# Patient Record
Sex: Male | Born: 1985 | Race: White | Hispanic: No | Marital: Married | Smoking: Former smoker
Health system: Southern US, Community
[De-identification: ages and names within clinical notes are randomized; demographics above are authoritative.]

## PROBLEM LIST (undated history)

## (undated) DIAGNOSIS — T8859XA Other complications of anesthesia, initial encounter: Secondary | ICD-10-CM

## (undated) DIAGNOSIS — Z8489 Family history of other specified conditions: Secondary | ICD-10-CM

## (undated) DIAGNOSIS — K5730 Diverticulosis of large intestine without perforation or abscess without bleeding: Secondary | ICD-10-CM

## (undated) DIAGNOSIS — M1990 Unspecified osteoarthritis, unspecified site: Secondary | ICD-10-CM

## (undated) HISTORY — PX: COLONOSCOPY: SHX174

## (undated) HISTORY — PX: OTHER SURGICAL HISTORY: SHX169

---

## 2011-08-10 ENCOUNTER — Other Ambulatory Visit (HOSPITAL_COMMUNITY): Payer: Self-pay | Admitting: *Deleted

## 2011-08-10 ENCOUNTER — Ambulatory Visit (HOSPITAL_COMMUNITY)
Admission: RE | Admit: 2011-08-10 | Discharge: 2011-08-10 | Disposition: A | Discharge status: Home / Self Care | Payer: BC Managed Care – PPO | Source: Ambulatory Visit | Attending: *Deleted | Admitting: *Deleted

## 2011-08-10 DIAGNOSIS — D72829 Elevated white blood cell count, unspecified: Secondary | ICD-10-CM | POA: Insufficient documentation

## 2011-08-10 DIAGNOSIS — K5730 Diverticulosis of large intestine without perforation or abscess without bleeding: Secondary | ICD-10-CM | POA: Insufficient documentation

## 2011-08-10 DIAGNOSIS — R109 Unspecified abdominal pain: Secondary | ICD-10-CM | POA: Insufficient documentation

## 2011-08-10 MED ORDER — IOHEXOL 300 MG/ML  SOLN
100.0000 mL | Freq: Once | INTRAMUSCULAR | Status: AC | PRN
  Administered 2011-08-10: 100 mL via INTRAVENOUS

## 2012-08-18 ENCOUNTER — Ambulatory Visit: Payer: Self-pay | Admitting: Gastroenterology

## 2012-08-19 LAB — PATHOLOGY REPORT

## 2012-09-09 ENCOUNTER — Ambulatory Visit: Payer: BC Managed Care – PPO | Admitting: Family Medicine

## 2013-02-23 DIAGNOSIS — K5792 Diverticulitis of intestine, part unspecified, without perforation or abscess without bleeding: Secondary | ICD-10-CM | POA: Insufficient documentation

## 2013-05-21 DIAGNOSIS — E669 Obesity, unspecified: Secondary | ICD-10-CM | POA: Insufficient documentation

## 2013-05-21 DIAGNOSIS — L906 Striae atrophicae: Secondary | ICD-10-CM | POA: Insufficient documentation

## 2013-06-02 HISTORY — PX: COLON RESECTION SIGMOID: SHX6737

## 2015-02-14 DIAGNOSIS — Z8719 Personal history of other diseases of the digestive system: Secondary | ICD-10-CM | POA: Insufficient documentation

## 2015-02-14 DIAGNOSIS — M7541 Impingement syndrome of right shoulder: Secondary | ICD-10-CM | POA: Insufficient documentation

## 2015-02-14 DIAGNOSIS — J302 Other seasonal allergic rhinitis: Secondary | ICD-10-CM | POA: Insufficient documentation

## 2016-09-03 DIAGNOSIS — M7711 Lateral epicondylitis, right elbow: Secondary | ICD-10-CM | POA: Diagnosis not present

## 2017-03-10 DIAGNOSIS — Z0000 Encounter for general adult medical examination without abnormal findings: Secondary | ICD-10-CM | POA: Diagnosis not present

## 2017-08-04 ENCOUNTER — Other Ambulatory Visit: Payer: Self-pay | Admitting: Orthopedic Surgery

## 2017-08-04 DIAGNOSIS — M25562 Pain in left knee: Secondary | ICD-10-CM

## 2017-08-13 ENCOUNTER — Ambulatory Visit
Admission: RE | Admit: 2017-08-13 | Discharge: 2017-08-13 | Disposition: A | Discharge status: Home / Self Care | Payer: No Typology Code available for payment source | Source: Ambulatory Visit | Attending: Orthopedic Surgery | Admitting: Orthopedic Surgery

## 2017-08-13 DIAGNOSIS — R937 Abnormal findings on diagnostic imaging of other parts of musculoskeletal system: Secondary | ICD-10-CM | POA: Insufficient documentation

## 2017-08-13 DIAGNOSIS — M25562 Pain in left knee: Secondary | ICD-10-CM | POA: Diagnosis not present

## 2018-02-05 ENCOUNTER — Other Ambulatory Visit: Payer: Self-pay | Admitting: Orthopedic Surgery

## 2018-02-05 DIAGNOSIS — M7711 Lateral epicondylitis, right elbow: Secondary | ICD-10-CM

## 2018-02-19 ENCOUNTER — Ambulatory Visit
Admission: RE | Admit: 2018-02-19 | Discharge: 2018-02-19 | Disposition: A | Discharge status: Home / Self Care | Payer: No Typology Code available for payment source | Source: Ambulatory Visit | Attending: Orthopedic Surgery | Admitting: Orthopedic Surgery

## 2018-02-19 DIAGNOSIS — X58XXXA Exposure to other specified factors, initial encounter: Secondary | ICD-10-CM | POA: Insufficient documentation

## 2018-02-19 DIAGNOSIS — M7711 Lateral epicondylitis, right elbow: Secondary | ICD-10-CM | POA: Diagnosis not present

## 2018-02-19 DIAGNOSIS — S56511A Strain of other extensor muscle, fascia and tendon at forearm level, right arm, initial encounter: Secondary | ICD-10-CM | POA: Insufficient documentation

## 2018-02-26 ENCOUNTER — Ambulatory Visit: Payer: No Typology Code available for payment source

## 2018-03-02 DIAGNOSIS — M7712 Lateral epicondylitis, left elbow: Secondary | ICD-10-CM | POA: Insufficient documentation

## 2018-03-05 ENCOUNTER — Other Ambulatory Visit: Payer: Self-pay

## 2018-03-05 ENCOUNTER — Encounter: Payer: Self-pay | Admitting: *Deleted

## 2018-03-11 ENCOUNTER — Ambulatory Visit
Admission: RE | Admit: 2018-03-11 | Discharge: 2018-03-11 | Disposition: A | Discharge status: Home / Self Care | Payer: No Typology Code available for payment source | Source: Ambulatory Visit | Attending: Surgery | Admitting: Surgery

## 2018-03-11 ENCOUNTER — Encounter
Admission: RE | Disposition: A | Discharge status: Home / Self Care | Payer: Self-pay | Source: Ambulatory Visit | Attending: Surgery

## 2018-03-11 ENCOUNTER — Ambulatory Visit: Payer: No Typology Code available for payment source | Admitting: Anesthesiology

## 2018-03-11 DIAGNOSIS — M7711 Lateral epicondylitis, right elbow: Secondary | ICD-10-CM | POA: Insufficient documentation

## 2018-03-11 DIAGNOSIS — Z87891 Personal history of nicotine dependence: Secondary | ICD-10-CM | POA: Diagnosis not present

## 2018-03-11 HISTORY — PX: REPAIR EXTENSOR TENDON: SHX5382

## 2018-03-11 HISTORY — DX: Unspecified osteoarthritis, unspecified site: M19.90

## 2018-03-11 HISTORY — DX: Diverticulosis of large intestine without perforation or abscess without bleeding: K57.30

## 2018-03-11 SURGERY — REPAIR, TENDON, EXTENSOR
Anesthesia: General | Site: Elbow | Laterality: Right

## 2018-03-11 MED ORDER — PROPOFOL 10 MG/ML IV BOLUS
INTRAVENOUS | Status: DC | PRN
  Administered 2018-03-11: 50 mg via INTRAVENOUS
  Administered 2018-03-11: 150 mg via INTRAVENOUS

## 2018-03-11 MED ORDER — POTASSIUM CHLORIDE IN NACL 20-0.9 MEQ/L-% IV SOLN: INTRAVENOUS | Status: DC

## 2018-03-11 MED ORDER — DEXAMETHASONE SODIUM PHOSPHATE 4 MG/ML IJ SOLN
INTRAMUSCULAR | Status: DC | PRN
  Administered 2018-03-11: 4 mg via INTRAVENOUS

## 2018-03-11 MED ORDER — MIDAZOLAM HCL 5 MG/5ML IJ SOLN
INTRAMUSCULAR | Status: DC | PRN
  Administered 2018-03-11: 2 mg via INTRAVENOUS

## 2018-03-11 MED ORDER — CEFAZOLIN SODIUM-DEXTROSE 2-4 GM/100ML-% IV SOLN
2.0000 g | Freq: Once | INTRAVENOUS | Status: AC
  Administered 2018-03-11: 2 g via INTRAVENOUS

## 2018-03-11 MED ORDER — GLYCOPYRROLATE 0.2 MG/ML IJ SOLN
INTRAMUSCULAR | Status: DC | PRN
  Administered 2018-03-11: 0.1 mg via INTRAVENOUS

## 2018-03-11 MED ORDER — BUPIVACAINE HCL (PF) 0.5 % IJ SOLN
INTRAMUSCULAR | Status: DC | PRN
  Administered 2018-03-11: 10 mL

## 2018-03-11 MED ORDER — HYDROCODONE-ACETAMINOPHEN 5-325 MG PO TABS: 1.0000 | ORAL_TABLET | Freq: Four times a day (QID) | ORAL | 0 refills | Status: DC | PRN

## 2018-03-11 MED ORDER — ACETAMINOPHEN 325 MG PO TABS: 325.0000 mg | ORAL_TABLET | ORAL | Status: DC | PRN

## 2018-03-11 MED ORDER — PROMETHAZINE HCL 25 MG/ML IJ SOLN: 6.2500 mg | INTRAMUSCULAR | Status: DC | PRN

## 2018-03-11 MED ORDER — FENTANYL CITRATE (PF) 100 MCG/2ML IJ SOLN: 25.0000 ug | INTRAMUSCULAR | Status: DC | PRN

## 2018-03-11 MED ORDER — FENTANYL CITRATE (PF) 100 MCG/2ML IJ SOLN
INTRAMUSCULAR | Status: DC | PRN
  Administered 2018-03-11: 100 ug via INTRAVENOUS
  Administered 2018-03-11: 12.5 ug via INTRAVENOUS

## 2018-03-11 MED ORDER — OXYCODONE HCL 5 MG PO TABS
5.0000 mg | ORAL_TABLET | Freq: Once | ORAL | Status: AC | PRN
  Administered 2018-03-11: 5 mg via ORAL

## 2018-03-11 MED ORDER — ONDANSETRON HCL 4 MG/2ML IJ SOLN
INTRAMUSCULAR | Status: DC | PRN
  Administered 2018-03-11: 4 mg via INTRAVENOUS

## 2018-03-11 MED ORDER — OXYCODONE HCL 5 MG/5ML PO SOLN: 5.0000 mg | Freq: Once | ORAL | Status: AC | PRN

## 2018-03-11 MED ORDER — LACTATED RINGERS IV SOLN
10.0000 mL/h | INTRAVENOUS | Status: DC
  Administered 2018-03-11: 10 mL/h via INTRAVENOUS

## 2018-03-11 MED ORDER — LIDOCAINE HCL (CARDIAC) PF 100 MG/5ML IV SOSY
PREFILLED_SYRINGE | INTRAVENOUS | Status: DC | PRN
  Administered 2018-03-11: 40 mg via INTRATRACHEAL

## 2018-03-11 MED ORDER — ACETAMINOPHEN 160 MG/5ML PO SOLN: 325.0000 mg | ORAL | Status: DC | PRN

## 2018-03-11 SURGICAL SUPPLY — 35 items
ANCHOR SUT 1.45 SZ 1 SHORT (Anchor) ×2 IMPLANT
BANDAGE ELASTIC 3 LF NS (GAUZE/BANDAGES/DRESSINGS) ×3 IMPLANT
BANDAGE ELASTIC 4 LF NS (GAUZE/BANDAGES/DRESSINGS) ×3 IMPLANT
BENZOIN TINCTURE PRP APPL 2/3 (GAUZE/BANDAGES/DRESSINGS) ×2 IMPLANT
BNDG COHESIVE 4X5 TAN STRL (GAUZE/BANDAGES/DRESSINGS) ×3 IMPLANT
BNDG ESMARK 4X12 TAN STRL LF (GAUZE/BANDAGES/DRESSINGS) ×3 IMPLANT
CANISTER SUCT 1200ML W/VALVE (MISCELLANEOUS) ×3 IMPLANT
CHLORAPREP W/TINT 26ML (MISCELLANEOUS) ×3 IMPLANT
CLOSURE WOUND 1/4X4 (GAUZE/BANDAGES/DRESSINGS) ×1
COVER LIGHT HANDLE UNIVERSAL (MISCELLANEOUS) ×6 IMPLANT
CUFF TOURN SGL QUICK 18 (TOURNIQUET CUFF) ×2 IMPLANT
DRAPE U-SHAPE 48X52 POLY STRL (PACKS) ×3 IMPLANT
ELECT REM PT RETURN 9FT ADLT (ELECTROSURGICAL) ×3
ELECTRODE REM PT RTRN 9FT ADLT (ELECTROSURGICAL) ×1 IMPLANT
GAUZE PETRO XEROFOAM 1X8 (MISCELLANEOUS) ×3 IMPLANT
GAUZE SPONGE 4X4 12PLY STRL (GAUZE/BANDAGES/DRESSINGS) ×3 IMPLANT
GLOVE BIO SURGEON STRL SZ8 (GLOVE) ×2 IMPLANT
GLOVE INDICATOR 8.0 STRL GRN (GLOVE) ×3 IMPLANT
GOWN STRL REUS W/ TWL LRG LVL3 (GOWN DISPOSABLE) ×1 IMPLANT
GOWN STRL REUS W/ TWL XL LVL3 (GOWN DISPOSABLE) ×1 IMPLANT
GOWN STRL REUS W/TWL LRG LVL3 (GOWN DISPOSABLE) ×2
GOWN STRL REUS W/TWL XL LVL3 (GOWN DISPOSABLE) ×2
KIT TURNOVER KIT A (KITS) ×3 IMPLANT
LOOP VESSEL RED MINI 1.3X0.9 (MISCELLANEOUS) ×1 IMPLANT
LOOPS RED MINI 1.3MMX0.9MM (MISCELLANEOUS)
NS IRRIG 500ML POUR BTL (IV SOLUTION) ×3 IMPLANT
PACK EXTREMITY ARMC (MISCELLANEOUS) ×3 IMPLANT
SLING ARM LRG DEEP (SOFTGOODS) ×2 IMPLANT
SPLINT WRIST LG RT TX900304 (SOFTGOODS) ×2 IMPLANT
STOCKINETTE IMPERVIOUS 9X36 MD (GAUZE/BANDAGES/DRESSINGS) ×3 IMPLANT
STRIP CLOSURE SKIN 1/4X4 (GAUZE/BANDAGES/DRESSINGS) ×1 IMPLANT
SUT VIC AB 0 CT1 36 (SUTURE) ×3 IMPLANT
SUT VIC AB 2-0 CT2 27 (SUTURE) ×2 IMPLANT
SUT VIC AB 3-0 SH 27 (SUTURE) ×2
SUT VIC AB 3-0 SH 27X BRD (SUTURE) ×1 IMPLANT

## 2018-03-11 NOTE — Anesthesia Preprocedure Evaluation (Signed)
Anesthesia Evaluation  Patient identified by MRN, date of birth, ID band  Reviewed: NPO status   History of Anesthesia Complications Negative for: history of anesthetic complications  Airway Mallampati: II  TM Distance: >3 FB Neck ROM: full    Dental no notable dental hx.    Pulmonary neg pulmonary ROS, former smoker,    Pulmonary exam normal        Cardiovascular negative cardio ROS Normal cardiovascular exam     Neuro/Psych negative neurological ROS  negative psych ROS   GI/Hepatic Neg liver ROS, H/o diverticulitis > colon resection 2015    Endo/Other  Morbid obesity (bmi=33)  Renal/GU   negative genitourinary   Musculoskeletal  (+) Arthritis ,   Abdominal   Peds  Hematology negative hematology ROS (+)   Anesthesia Other Findings   Reproductive/Obstetrics                             Anesthesia Physical Anesthesia Plan  ASA: II  Anesthesia Plan: General   Post-op Pain Management:    Induction:   PONV Risk Score and Plan:   Airway Management Planned:   Additional Equipment:   Intra-op Plan:   Post-operative Plan:   Informed Consent: I have reviewed the patients History and Physical, chart, labs and discussed the procedure including the risks, benefits and alternatives for the proposed anesthesia with the patient or authorized representative who has indicated his/her understanding and acceptance.     Plan Discussed with: CRNA  Anesthesia Plan Comments: (Surgeon requests GA.)        Anesthesia Quick Evaluation

## 2018-03-11 NOTE — H&P (Signed)
Paper H&P to be scanned into permanent record. H&P reviewed and patient re-examined. No changes. 

## 2018-03-11 NOTE — Transfer of Care (Signed)
Immediate Anesthesia Transfer of Care Note  Patient: Perry Martin  Procedure(s) Performed: DEBRIDEMENT OF THE COMMON EXTENSOR ORGIN OF RIGHT ELBOW (Right Elbow)  Patient Location: PACU  Anesthesia Type: General  Level of Consciousness: awake, alert  and patient cooperative  Airway and Oxygen Therapy: Patient Spontanous Breathing and Patient connected to supplemental oxygen  Post-op Assessment: Post-op Vital signs reviewed, Patient's Cardiovascular Status Stable, Respiratory Function Stable, Patent Airway and No signs of Nausea or vomiting  Post-op Vital Signs: Reviewed and stable  Complications: No apparent anesthesia complications

## 2018-03-11 NOTE — Discharge Instructions (Signed)
General Anesthesia, Adult, Care After These instructions provide you with information about caring for yourself after your procedure. Your health care provider may also give you more specific instructions. Your treatment has been planned according to current medical practices, but problems sometimes occur. Call your health care provider if you have any problems or questions after your procedure. What can I expect after the procedure? After the procedure, it is common to have:  Vomiting.  A sore throat.  Mental slowness.  It is common to feel:  Nauseous.  Cold or shivery.  Sleepy.  Tired.  Sore or achy, even in parts of your body where you did not have surgery.  Follow these instructions at home: For at least 24 hours after the procedure:  Do not: ? Participate in activities where you could fall or become injured. ? Drive. ? Use heavy machinery. ? Drink alcohol. ? Take sleeping pills or medicines that cause drowsiness. ? Make important decisions or sign legal documents. ? Take care of children on your own.  Rest. Eating and drinking  If you vomit, drink water, juice, or soup when you can drink without vomiting.  Drink enough fluid to keep your urine clear or pale yellow.  Make sure you have little or no nausea before eating solid foods.  Follow the diet recommended by your health care provider. General instructions  Have a responsible adult stay with you until you are awake and alert.  Return to your normal activities as told by your health care provider. Ask your health care provider what activities are safe for you.  Take over-the-counter and prescription medicines only as told by your health care provider.  If you smoke, do not smoke without supervision.  Keep all follow-up visits as told by your health care provider. This is important. Contact a health care provider if:  You continue to have nausea or vomiting at home, and medicines are not helpful.  You  cannot drink fluids or start eating again.  You cannot urinate after 8-12 hours.  You develop a skin rash.  You have fever.  You have increasing redness at the site of your procedure. Get help right away if:  You have difficulty breathing.  You have chest pain.  You have unexpected bleeding.  You feel that you are having a life-threatening or urgent problem. This information is not intended to replace advice given to you by your health care provider. Make sure you discuss any questions you have with your health care provider. Document Released: 07/15/2000 Document Revised: 09/11/2015 Document Reviewed: 03/23/2015 Elsevier Interactive Patient Education  2018 [employer]   Orthopedic discharge instructions: Keep dressing dry and intact. Keep hand elevated above heart level. May shower after dressing removed on postop day 4 (Sunday). Cover staples with Band-Aids after drying off. Apply ice to affected area frequently. Take ibuprofen 800 mg TID with meals for 7-10 days, then as necessary. Take ES Tylenol or pain medication as prescribed when needed.  Return for follow-up in 10-14 days or as scheduled.

## 2018-03-11 NOTE — Anesthesia Postprocedure Evaluation (Signed)
Anesthesia Post Note  Patient: Perry Martin  Procedure(s) Performed: DEBRIDEMENT OF THE COMMON EXTENSOR ORGIN OF RIGHT ELBOW (Right Elbow)  Patient location during evaluation: PACU Anesthesia Type: General Level of consciousness: awake and alert Pain management: pain level controlled Vital Signs Assessment: post-procedure vital signs reviewed and stable Respiratory status: spontaneous breathing, nonlabored ventilation, respiratory function stable and patient connected to nasal cannula oxygen Cardiovascular status: blood pressure returned to baseline and stable Postop Assessment: no apparent nausea or vomiting Anesthetic complications: no    Areya Lemmerman

## 2018-03-11 NOTE — Op Note (Signed)
03/11/2018  1:21 PM  Patient:   Perry Martin  Pre-Op Diagnosis:   Chronic lateral epicondylitis, right elbow.  Post-Op Diagnosis:   Same.  Procedure:   Debridement/repair of common extensor origin, right elbow.  Surgeon:   Maryagnes AmosJ. Jeffrey , MD  Assistant:   Joni FearsJohanna Young, PA-S  Anesthesia:   General LMA  Findings:   As above.  Complications:   None  EBL:   2 cc  Fluids:   750 cc crystalloid  TT:   35 minutes at 250 mmHg  Drains:   None  Closure:   3-0 Vicryl subcuticular sutures  Implants:   Biomet JuggerKnot 1.4 mm anchor x1  Brief Clinical Note:   The patient is a 32 year old male with a long history of lateral-sided right elbow pain. He symptoms have persisted despite medications, activity modification, splinting, injections, etc. His history and exam findings are consistent with chronic lateral epicondylitis which was confirmed by MRI scan. The patient presents at this time for debridement and repair of the common extensor origin of his right elbow.  Procedure:   The patient was brought into the operating room and lain in the supine position. After adequate general laryngal mask anesthesia was obtained, the patient's right upper extremity was prepped with ChloraPrep solution before being draped sterilely. Preoperative antibiotics were administered. A timeout was performed to verify the appropriate surgical site before the limb was exsanguinated with an Esmarch and the tourniquet inflated to 250 mmHg. An approximately 4-5 cm incision was made over the lateral aspect of the elbow beginning at the lateral epicondyle and extending distally in line with the common extensor origin tendons. The incision was carried down through the subcutaneous tissues to expose the common extensor origin. The extensor carpi radialis brevis tendon was identified and incised in line with the incision. The areas of significantly degenerative tendinous tissues were debrided sharply with a #15 blade  down to the epicondylar region. The bone itself was roughened with a rongeur to provide a good bleeding surface for reattachment of the tendon. A Biomet 1.4 mm JuggerKnot anchor was inserted into the exposed bone of the lateral epicondyle. The sutures were passed through the tendon and tied securely to effect the repair.   The wound was copiously irrigated with bacitracin saline solution using bulb irrigation before several #0 Vicryl interrupted sutures were used to close the longitudinal incision in the tendon in a side-to-side fashion. The subcutaneous tissues were closed using 2-0 Vicryl interrupted sutures before the skin was closed using 3-0 Vicryl subcuticular sutures. Benzoin and Steri-Strips were applied to the skin. A total of 10 cc of 0.5% plain Sensorcaine was injected in and around the incision to help with postoperative analgesia before a sterile bulky dressing was applied to the elbow. A Velcro wrist immobilizer was applied before the patient was awakened, extubated, and returned to the recovery room in satisfactory condition after tolerating the procedure well.

## 2018-03-11 NOTE — Anesthesia Procedure Notes (Signed)
Procedure Name: LMA Insertion Date/Time: 03/11/2018 12:22 PM Performed by: Jimmy PicketAmyot, Shyanne Mcclary, CRNA Pre-anesthesia Checklist: Patient identified, Emergency Drugs available, Suction available, Timeout performed and Patient being monitored Patient Re-evaluated:Patient Re-evaluated prior to induction Oxygen Delivery Method: Circle system utilized Preoxygenation: Pre-oxygenation with 100% oxygen Induction Type: IV induction LMA: LMA inserted LMA Size: 4.0 Number of attempts: 1 Placement Confirmation: positive ETCO2 and breath sounds checked- equal and bilateral Tube secured with: Tape

## 2018-03-12 ENCOUNTER — Encounter: Payer: Self-pay | Admitting: Surgery

## 2018-06-26 ENCOUNTER — Encounter: Payer: Self-pay | Admitting: Urology

## 2018-06-26 ENCOUNTER — Ambulatory Visit (INDEPENDENT_AMBULATORY_CARE_PROVIDER_SITE_OTHER): Payer: No Typology Code available for payment source | Admitting: Urology

## 2018-06-26 VITALS — BP 145/69 | HR 101 | Ht 73.0 in | Wt 259.8 lb

## 2018-06-26 DIAGNOSIS — Z3009 Encounter for other general counseling and advice on contraception: Secondary | ICD-10-CM | POA: Diagnosis not present

## 2018-06-26 MED ORDER — DIAZEPAM 5 MG PO TABS: 15.0000 mg | ORAL_TABLET | Freq: Once | ORAL | 0 refills | Status: AC

## 2018-06-26 NOTE — Patient Instructions (Signed)
Pre-Vasectomy Instructions  STOP all aspirin or blood thinners (Aspirin, Plavix, Coumadin, Warfarin, Motrin, Ibuprofen, Advil, Aleve, Naproxen, Naprosyn) for 7 days prior to the procedure.  If you have any questions about stopping these medications please contact your primary care physician or cardiologist.  Shave all hair from the upper scrotum on the day of the procedure.  This means just under the penis onto the scrotal sac.  The area shaved should measure about 2-3 inches around.  You may lather the scrotum with soap and water, and shave with a safety razor.  After shaving the area, thoroughly wash the penis and the scrotum, then shower or bathe to remove all the loose hairs.  If needed, wash the area again just before coming in for your circumcision.  It is recommended to have a light meal an hour or so prior to the procedure.  Bring a scrotal support (jock strap or suspensory, or tight jockey shorts or underwear).  Wear comfortable pants or shorts.  While the actual procedure usually takes about 45 minutes, you should be prepared to stay in the office for approximately one hour.  Bring someone with you to drive you home.  If you have any questions or concerns, please feel free to call the office at [phone].     Vasectomy Vasectomy is a procedure in which the tube that carries sperm from the testicle to the urethra (vas deferens) is tied. It may also be cut. The procedure blocks sperm from going through the vas deferens and penis during ejaculation. This ensures that sperm does not go into the vagina during sex. Vasectomy does not affect your sexual desire or performance, and does not prevent sexually transmitted diseases. Vasectomy is considered a permanent and very effective form of birth control (contraception). The decision to have a vasectomy should not be made during a stressful situation, such as after the loss of a pregnancy or a divorce. You and your partner should make the  decision to have a vasectomy when you are sure that you do not want children in the future. Tell a health care provider about:  Any allergies you have.  All medicines you are taking, including vitamins, herbs, eye drops, creams, and over-the-counter medicines.  Any problems you or family members have had with anesthetic medicines.  Any blood disorders you have.  Any surgeries you have had.  Any medical conditions you have. What are the risks? Generally, this is a safe procedure. However, problems may occur, including:  Infection.  Bleeding and swelling of the scrotum.  Allergic reactions to medicines.  Failure of the procedure to prevent pregnancy. There is a very small chance that the cut ends of the vas deferens may reconnect (recanalization), meaning that you could still make a woman pregnant.  Pain in the scrotum that continues after healing from the procedure. What happens before the procedure?  Ask your health care provider about: ? Changing or stopping your regular medicines. This is especially important if you are taking diabetes medicines or blood thinners. ? Taking over-the-counter medicines, vitamins, herbs, and supplements. ? Taking medicines such as aspirin and ibuprofen. These medicines can thin your blood. Do not take these medicines unless your health care provider tells you to take them.  You may be asked to shower with a germ-killing soap.  Plan to have someone take you home from the hospital or clinic. What happens during the procedure?   To lower your risk of infection: ? Your health care team will wash  or sanitize their hands. ? Hair may be removed from the surgical area. ? Your scrotum will be washed with soap.  You will be given one or more of the following: ? A medicine to help you relax (sedative). You may be instructed to take this a few hours before the procedure. ? A medicine to numb the area (local anesthetic).  Your health care provider  will feel (palpate) for your vas deferens.  To reach the vas deferens, one of two methods may be used: ? A very small incision may be made in your scrotum. ? A punctured opening may be made in your scrotum, without an incision.  Your vas deferens will be pulled out of your scrotum, and may be: ? Tied off. ? Cut and possibly burned (cauterized) at the ends to seal them off.  The vas deferens will be put back into your scrotum.  The incision or puncture opening will be closed with absorbable stitches (sutures). The sutures will eventually dissolve and will not need to be removed after the procedure. The procedure may vary among health care providers and hospitals. What happens after the procedure?  You will be monitored to make sure that you do not experience problems.  You will be asked not to ejaculate for at least 1 week after the procedure, or as long as directed.  You will need to use a different form of contraception for 2-4 months after the procedure, until you have test results confirming that there are no sperm in your semen.  You may be given scrotal support to wear, such as a jock strap or underwear with a supportive pouch.  Do not drive for 24 hours if you were given a sedative to help you relax. Summary  Vasectomy is considered a permanent and very effective form of birth control (contraception). The procedure prevents sperm from being released during ejaculation.  Your scrotum will be numbed with medicine (local anesthetic) for the procedure.  After the procedure, you will be asked not to ejaculate for at least 1 week, or for as long as directed. You will also need to use a different form of contraception until your health care provider examines you and finds that there are no sperm in your semen. This information is not intended to replace advice given to you by your health care provider. Make sure you discuss any questions you have with your health care  provider. Document Released: 06/29/2002 Document Revised: 10/10/2017 Document Reviewed: 07/05/2016 Elsevier Interactive Patient Education  2019 [employer]vier Inc.  

## 2018-06-26 NOTE — Progress Notes (Signed)
06/26/2018 12:49 PM   Perry Martin 1985-10-04 [id number]  Referring provider: Jerl Mina, MD [address] [facility] [place], [place] [address]  No chief complaint on file.   HPI:  Jazper has two kids - age 35 yo girl and 18 month old boy. He is married. They considered a vasectomy after the first and now are ready for it. They planned on two kids. The tried OCP's and implant. No inguinal or scrotal surgery.   Modifying factors: There are no other modifying factors  Associated signs and symptoms: There are no other associated signs and symptoms Aggravating and relieving factors: There are no other aggravating or relieving factors Severity: Moderate Duration: Persistent   PMH: Past Medical History:  Diagnosis Date  . Arthritis    (R) shoulder, (L) knee  . Diverticula of colon    History of    Surgical History: Past Surgical History:  Procedure Laterality Date  . COLON RESECTION SIGMOID  06/02/2013  . COLONOSCOPY    . REPAIR EXTENSOR TENDON Right 03/11/2018   Procedure: DEBRIDEMENT OF THE COMMON EXTENSOR ORGIN OF RIGHT ELBOW;  Surgeon: Christena Flake, MD;  Location: [facility];  Service: Orthopedics;  Laterality: Right;    Home Medications:  Allergies as of 06/26/2018   No Known Allergies     Medication List       Accurate as of June 26, 2018 12:49 PM. Always use your most recent med list.        cetirizine 10 MG tablet Commonly known as:  ZYRTEC Take 10 mg by mouth daily.   HYDROcodone-acetaminophen 5-325 MG tablet Commonly known as:  NORCO/VICODIN Take 1-2 tablets by mouth every 6 (six) hours as needed for moderate pain.       Allergies: No Known Allergies  Family History: No family history on file.  Social History:  reports that he quit smoking about 6 years ago. His smoking use included cigarettes. He has a 0.50 pack-year smoking history. He has never used smokeless tobacco. He reports current alcohol use. No history on file  for drug.  ROS:                                        Physical Exam: There were no vitals taken for this visit.  Constitutional:  Alert and oriented, No acute distress. HEENT: [place] AT, moist mucus membranes.  Trachea midline, no masses. Cardiovascular: No clubbing, cyanosis, or edema. Respiratory: Normal respiratory effort, no increased work of breathing. GI: Abdomen is soft, nontender, nondistended, no abdominal masses GU: No CVA tenderness, penis circumcised and no mass or lesion; testicles descended bilaterally and palpably normal, vas normal  Lymph: No cervical or inguinal lymphadenopathy. Skin: No rashes, bruises or suspicious lesions. Neurologic: Grossly intact, no focal deficits, moving all 4 extremities. Psychiatric: Normal mood and affect.  Laboratory Data: No results found for: WBC, HGB, HCT, MCV, PLT  No results found for: CREATININE  No results found for: PSA  No results found for: TESTOSTERONE  No results found for: HGBA1C  Urinalysis No results found for: COLORURINE, APPEARANCEUR, LABSPEC, PHURINE, GLUCOSEU, HGBUR, BILIRUBINUR, KETONESUR, PROTEINUR, UROBILINOGEN, NITRITE, LEUKOCYTESUR  No results found for: LABMICR, WBCUA, RBCUA, LABEPIT, MUCUS, BACTERIA  Pertinent Imaging:  No results found for this or any previous visit. No results found for this or any previous visit. No results found for this or any previous visit. No  results found for this or any previous visit. No results found for this or any previous visit. No results found for this or any previous visit. No results found for this or any previous visit. No results found for this or any previous visit.  Assessment & Plan:    I discussed with the patient normal male genitourinary anatomy and passage of sperm. We discussed the nature of vasectomy and that vasectomy is intended to be a permanent form of contraception. Although options do exist for fertility after vasectomy they  are not always successful and can be quite expensive. We discussed vasectomy does not produce immediate sterility and another  form of contraception is required until a postvasectomy semen analysis confirms no sperm. We discussed this can take several weeks to months. We discussed that vasectomy is not 100% successful with the risk of pregnancy approximately 1 in 2000 following the procedure which may be due to late failure from rejoining of the vas ends.  Rarely repeat vasectomy is required. We discussed risks such as bleeding, infection, testicular atrophy, sperm granuloma, acute pain and permanent chronic scrotal pain among others. We discussed there are other permanent and nonpermanent alternatives to vasectomy including male sterilization. We discussed postop care and that no heavy lifting, ejaculation or strenuous exercise is recommended for one week. All questions answered.  We discussed 1 of my colleagues may be doing the case.   No follow-ups on file.  Jerilee Field, MD  [facility] [address] [place], [place] [address] [phone]

## 2018-07-08 ENCOUNTER — Other Ambulatory Visit: Payer: Self-pay

## 2018-07-08 ENCOUNTER — Encounter: Payer: Self-pay | Admitting: Urology

## 2018-07-08 ENCOUNTER — Ambulatory Visit (INDEPENDENT_AMBULATORY_CARE_PROVIDER_SITE_OTHER): Payer: No Typology Code available for payment source | Admitting: Urology

## 2018-07-08 VITALS — BP 153/88 | HR 85 | Ht 73.0 in | Wt 253.0 lb

## 2018-07-08 DIAGNOSIS — Z302 Encounter for sterilization: Secondary | ICD-10-CM | POA: Diagnosis not present

## 2018-07-08 NOTE — Patient Instructions (Signed)

## 2018-07-08 NOTE — Progress Notes (Signed)
07/08/18  CC: No chief complaint on file.   HPI: He returns for vasectomy. He read the vasectomy information and understands vasectomy is considered permanent.  He has no other questions.  He is feeling well today.  No fever cough.   There were no vitals taken for this visit. NED. A&Ox3.   No respiratory distress   Abd soft, NT, ND Normal external genitalia with patent urethral meatus   Bilateral Vasectomy Procedure  Procedure: - Patient's scrotum was prepped and draped for vasectomy. - The vas was palpated through the scrotal skin on the left. - 1% Xylocaine was injected into the skin and surrounding tissue for placement  - A bladeless technique was used to open the overlying skin (sharp dissection) - The left vas was isolated and brought up through the incision with the vas clmap exposing that structure. - Bleeding points were cauterized as they occurred. - The vas was free from the surrounding structures and brought to the view. - A segment was positioned for placement with a hemostat. - A second hemostat was placed and a small segment between the two hemostats and was removed for inspection. - Each end of the transected vas lumen was fulgurated/ obliterated using needlepoint electrocautery -A fascial interposition was performed on testicular end of the vas using #3-0 chromic suture -The same procedure was performed on the right after the vas on the right was identified, anesthetized, and stabilized. - A single suture of #3-0 chromic catgut was used to close each lateral scrotal skin incision - A dressing was applied.  Assistant: None  Post-Procedure: - Patient was instructed in care of the operative area - A specimen is to be delivered in 12 weeks   -Discussed again vasectomy does not work immediately and another form of contraception is to be used until post vasectomy semen analysis is negative   Jerilee Field, MD

## 2018-10-06 ENCOUNTER — Other Ambulatory Visit: Payer: Self-pay

## 2018-10-06 DIAGNOSIS — Z3009 Encounter for other general counseling and advice on contraception: Secondary | ICD-10-CM

## 2018-10-07 ENCOUNTER — Other Ambulatory Visit: Payer: No Typology Code available for payment source

## 2018-10-07 ENCOUNTER — Other Ambulatory Visit: Payer: Self-pay

## 2018-10-07 DIAGNOSIS — Z3009 Encounter for other general counseling and advice on contraception: Secondary | ICD-10-CM

## 2018-10-08 LAB — POST-VAS SPERM EVALUATION,QUAL: Volume: 2.8 mL

## 2018-10-09 ENCOUNTER — Telehealth: Payer: Self-pay

## 2018-10-09 NOTE — Telephone Encounter (Signed)
Called pt informed him of the results below. Pt gave verbal understanding.  

## 2018-10-09 NOTE — Telephone Encounter (Signed)
-----   Message from Festus Aloe, MD sent at 10/09/2018  9:05 AM EDT ----- Notify patient his semen analysis was negative.  No sperm were noted.  He can rely on the vasectomy and stop other birth control.  Thanks Oki-have a great weekend. ----- Message ----- From: Gordy Clement, [place]: 10/09/2018   8:41 AM EDT To: Festus Aloe, MD   ----- Message ----- From: Interface, [facility] Lab Results In Sent: 10/08/2018   4:36 PM EDT To: Rowe Robert Clinical

## 2019-03-17 ENCOUNTER — Other Ambulatory Visit: Payer: Self-pay

## 2019-03-17 DIAGNOSIS — Z20822 Contact with and (suspected) exposure to covid-19: Secondary | ICD-10-CM

## 2019-03-18 LAB — NOVEL CORONAVIRUS, NAA: SARS-CoV-2, NAA: DETECTED — AB

## 2019-06-04 ENCOUNTER — Other Ambulatory Visit: Payer: Self-pay

## 2019-06-04 ENCOUNTER — Encounter: Payer: Self-pay | Admitting: Dietician

## 2019-06-04 ENCOUNTER — Encounter: Payer: No Typology Code available for payment source | Attending: Family Medicine | Admitting: Dietician

## 2019-06-04 VITALS — Ht 73.0 in | Wt 268.5 lb

## 2019-06-04 DIAGNOSIS — Z713 Dietary counseling and surveillance: Secondary | ICD-10-CM

## 2019-06-04 NOTE — Progress Notes (Signed)
[employer] [place] Employee "self referral" nutrition session: Start time: 915   End time: 1015  Height: 6'1" Weight: 268.5lbs with boots and hoodie  Met with employee to discuss his/her nutritional concerns and diet history.   Diet history:   Reports doing weight watchers program last year, lost to 233lbs including regular exercise, but has regained. Was unable to exercise or work for some time due to torn ligament in elbow and right shoulder impingement as well as knee pain.  Healthy eating habits have gradually declined since having children, highest weight was about 280lbs  He feels that evening snacking is the most unhealthy habit at this time.   Planning to resume exercise at [facility] soon.   Typical eating pattern: Breakfast: cereal; until recently skipped Snack: none Lunch: leftovers; lean cuisine meal; sandwich Snack: usu none; occ nuts or crackers (nabs) Supper: 5pm-- chicken (bbq, instapot, crockpot) with; beef or pork chop grilled or baked; rarely lean burger Snack: nuts; hummus and crackers; usu no sweets Beverages: water in am and evening; coffee at work; diet soda during the day    Education topics covered during this visit:  General nutrition/ Healthy eating  Weight Concerns  Other Medical Conditions: history of partial colectomy due to diverticular disease  Educational resources provided:  [employer] with food lists, sample meal pattern for approx. 1700kcal, 45% CHO, 25% protein, 30% fat  Mediterranean diet  Additional Comments: Patient and wife are generally making healthy food choices; he feels he needs to work more on snack choices and portions, as well as overall food portions.    Plan: Work on goals as established with patient direction, listed in Patient Instructions Return for follow-up 07/02/19

## 2019-06-04 NOTE — Patient Instructions (Signed)
   Continue working on eating healthy portions by eating slowly -- try chewing food more, putting fork down between bites, etc.   Control snack choices and portions -- keep to 1/4 cup nuts and combine with fruit, or veg and hummus and controlled portion of dressing or dip.   Increase vegetable and fruit intake overall -- generous portions with meals, include some for snacks.  Resume some regular exercise as able.

## 2019-07-02 ENCOUNTER — Other Ambulatory Visit: Payer: Self-pay

## 2019-07-02 ENCOUNTER — Encounter: Payer: No Typology Code available for payment source | Attending: Family Medicine | Admitting: Dietician

## 2019-07-02 ENCOUNTER — Encounter: Payer: Self-pay | Admitting: Dietician

## 2019-07-02 VITALS — Ht 73.0 in | Wt 266.0 lb

## 2019-07-02 DIAGNOSIS — Z713 Dietary counseling and surveillance: Secondary | ICD-10-CM

## 2019-07-02 NOTE — Patient Instructions (Signed)
   Continue with current eating pattern and food choices.  Incorporate strategies to help with slower eating and promote fulness

## 2019-07-02 NOTE — Progress Notes (Signed)
[employer] Employee "self referral" nutrition session: Start time: 1035   End time: 1110  Height: 6'1" Weight: 266.0 lbs with boots  Met with employee to discuss his/her nutritional concerns and diet history.   Progress:   Unable to resume gym exercise due to lack of child care.   Recently diagnosed with wrist sprain he thinks due to method of lifting his children, had cortisone injection several days ago.  Wife pre-preps meals and freezes for the week; both are working on healthier habits and weight loss.  He feels he is generally making healthy food choices and is exercising better portion control. He has noticed feeling more satisfied with less food when eating more slowly.  Typical eating pattern: Breakfast: eating more regularly -- grits occ with eggs; chicken and cheese in 1/2 pita Snack: none Lunch: lean cuisine, leftovers, ham sandwich Snack: occ crackers and hummus or nuts Supper: chicken most often (baked, grilled, sauteed) + rice, pasta/ tortellini + veg Snack: same as pm, continues to avoid sweets most of the time Beverages: water, reduced diet sodas, some coffee at work   Education topics covered during this visit:  General nutrition/ Healthy eating  Exercise   Educational resources provided:   Additional Comments:  Patient has concrete plans for resuming more frequent and more intensive exercise.   He is motivated to continue with healthy eating pattern.   Support from wife is very beneficial in following healthy lifestyle.    Plan: Continue with current eating pattern and resume regular exercise

## 2019-07-08 ENCOUNTER — Ambulatory Visit: Payer: No Typology Code available for payment source | Attending: Internal Medicine

## 2019-07-08 DIAGNOSIS — Z23 Encounter for immunization: Secondary | ICD-10-CM

## 2019-07-08 NOTE — Progress Notes (Signed)
   Covid-19 Vaccination Clinic  Name:  Perry Martin    MRN: [id number] DOB: [birth year]  07/08/2019  [name] was observed post Covid-19 immunization for 15 minutes without incident. He was provided with Vaccine Information Sheet and instruction to access the V-Safe system.   [name] was instructed to call 911 with any severe reactions post vaccine: [name] Kitchen Difficulty breathing  . Swelling of face and throat  . A fast heartbeat  . A bad rash all over body  . Dizziness and weakness   Immunizations Administered    Name Date Dose VIS Date Route   Pfizer COVID-19 Vaccine 07/08/2019 10:25 AM 0.3 mL 04/02/2019 Intramuscular   Manufacturer: [employer], [employer]   Lot: [id number]   NDC: [address]-0

## 2019-08-02 ENCOUNTER — Ambulatory Visit: Payer: No Typology Code available for payment source | Attending: Internal Medicine

## 2019-08-02 ENCOUNTER — Ambulatory Visit: Payer: No Typology Code available for payment source

## 2019-08-02 DIAGNOSIS — Z23 Encounter for immunization: Secondary | ICD-10-CM

## 2019-08-02 NOTE — Progress Notes (Signed)
   Covid-19 Vaccination Clinic  Name:  OAKLEY KOSSMAN    MRN: [id number] DOB: 1985/08/03  08/02/2019  Mr. Spadafore was observed post Covid-19 immunization for 15 minutes without incident. He was provided with Vaccine Information Sheet and instruction to access the V-Safe system.   Mr. Govan was instructed to call 911 with any severe reactions post vaccine: [name] Kitchen Difficulty breathing  . Swelling of face and throat  . A fast heartbeat  . A bad rash all over body  . Dizziness and weakness   Immunizations Administered    Name Date Dose VIS Date Route   Pfizer COVID-19 Vaccine 08/02/2019  2:26 PM 0.3 mL 04/02/2019 Intramuscular   Manufacturer: [employer], [employer]   Lot: [id number]   NDC: [address]-2

## 2019-08-06 ENCOUNTER — Other Ambulatory Visit: Payer: Self-pay

## 2019-08-06 ENCOUNTER — Encounter: Payer: No Typology Code available for payment source | Attending: Family Medicine | Admitting: Dietician

## 2019-08-06 VITALS — Ht 73.0 in | Wt 261.9 lb

## 2019-08-06 DIAGNOSIS — Z713 Dietary counseling and surveillance: Secondary | ICD-10-CM

## 2019-08-06 NOTE — Progress Notes (Signed)
Perry Martin Employee "self referral" nutrition session: Start time: 1110   End time: 1150  Height: 6'1" Weight: 261.9lbs  Met with employee to discuss his/her nutritional concerns and diet history.   Diet history:   Continues to make positive changes to eat more slowly and improve food choices.  He is following Mediterranean style diet most of the time.   Eating out less often in favor of healthier options at home.  Continues to partner with wife in working on healthy habits and weight loss.  More outdoor time/ physical activity, if not structured exercise plan, plans to resume more structured exercise as able after complete healing from wrist injury.   Typical eating pattern: Breakfast: granola bar recently (taking kids to school/ grandparents) Snack:  Lunch: chicken/ occ chicken burrito Snack: none or crackers with hummus or nuts Supper: mostly chicken + veg + starch Snack: none recently Beverages: mostly water   Education topics covered during this visit:  General nutrition/ Healthy eating  Weight Concerns    Additional Comments:  Patient feels confident he can continue with eating pattern to promote ongoing weight loss.  Offered additional assistance as needed with physician referral; encouraged him to call/ email with any questions or concerns.   Plan: Continue with current eating pattern and gradual increase in physical activity.

## 2019-08-07 ENCOUNTER — Telehealth: Payer: No Typology Code available for payment source | Admitting: Physician Assistant

## 2019-08-07 DIAGNOSIS — J019 Acute sinusitis, unspecified: Secondary | ICD-10-CM

## 2019-08-07 MED ORDER — AMOXICILLIN-POT CLAVULANATE 875-125 MG PO TABS: 1.0000 | ORAL_TABLET | Freq: Two times a day (BID) | ORAL | 0 refills | Status: DC

## 2019-08-07 NOTE — Progress Notes (Signed)

## 2020-02-15 ENCOUNTER — Other Ambulatory Visit (HOSPITAL_COMMUNITY): Payer: Self-pay | Admitting: Orthopedic Surgery

## 2020-02-15 ENCOUNTER — Other Ambulatory Visit: Payer: Self-pay | Admitting: Orthopedic Surgery

## 2020-02-15 DIAGNOSIS — M7712 Lateral epicondylitis, left elbow: Secondary | ICD-10-CM

## 2020-02-24 ENCOUNTER — Ambulatory Visit
Admission: RE | Admit: 2020-02-24 | Discharge: 2020-02-24 | Disposition: A | Discharge status: Home / Self Care | Payer: No Typology Code available for payment source | Source: Ambulatory Visit | Attending: Orthopedic Surgery | Admitting: Orthopedic Surgery

## 2020-02-24 ENCOUNTER — Other Ambulatory Visit: Payer: Self-pay

## 2020-02-24 DIAGNOSIS — M7712 Lateral epicondylitis, left elbow: Secondary | ICD-10-CM | POA: Insufficient documentation

## 2020-03-07 ENCOUNTER — Other Ambulatory Visit: Payer: Self-pay | Admitting: Surgery

## 2020-03-09 ENCOUNTER — Encounter
Admission: RE | Admit: 2020-03-09 | Discharge: 2020-03-09 | Disposition: A | Discharge status: Home / Self Care | Payer: No Typology Code available for payment source | Source: Ambulatory Visit | Attending: Surgery | Admitting: Surgery

## 2020-03-09 DIAGNOSIS — Z01818 Encounter for other preprocedural examination: Secondary | ICD-10-CM | POA: Diagnosis not present

## 2020-03-09 HISTORY — DX: Other complications of anesthesia, initial encounter: T88.59XA

## 2020-03-09 HISTORY — DX: Family history of other specified conditions: Z84.89

## 2020-03-09 NOTE — Patient Instructions (Signed)
Your procedure is scheduled on: 03/15/20 - Wednesday Report to the Registration Desk on the 1st floor of the [facility]. To find out your arrival time, please call [phone] between 1PM - 3PM on: 03/14/20- Wednesday  REMEMBER: Instructions that are not followed completely may result in serious medical risk, up to and including death; or upon the discretion of your surgeon and anesthesiologist your surgery may need to be rescheduled.  Do not eat food after midnight the night before surgery.  No gum chewing, lozengers or hard candies.  You may however, drink CLEAR liquids up to 2 hours before you are scheduled to arrive for your surgery. Do not drink anything within 2 hours of your scheduled arrival time.  Clear liquids include: - water  - apple juice without pulp - gatorade (not RED, PURPLE, OR BLUE) - black coffee or tea (Do NOT add milk or creamers to the coffee or tea) Do NOT drink anything that is not on this list.  In addition, your doctor has ordered for you to drink the provided  Ensure Pre-Surgery Clear Carbohydrate Drink  Drinking this carbohydrate drink up to two hours before surgery helps to reduce insulin resistance and improve patient outcomes. Please complete drinking 2 hours prior to scheduled arrival time.  TAKE THESE MEDICATIONS THE MORNING OF SURGERY WITH A SIP OF WATER: NA   Stop taking on 03/09/20-  One week prior to surgery:  Stop Anti-inflammatories (NSAIDS) such as Advil, Aleve, Ibuprofen, Motrin, Naproxen, Naprosyn and Aspirin based products such as Excedrin, Goodys Powder, BC Powder. Stop ANY OVER THE COUNTER supplements until after surgery.   (However, you may continue taking Vitamin D, Vitamin B, and multivitamin up until the day before surgery.)  No Alcohol for 24 hours before or after surgery.  No Smoking including e-cigarettes for 24 hours prior to surgery.  No chewable tobacco products for at least 6 hours prior to surgery.  No nicotine patches  on the day of surgery.  Do not use any "recreational" drugs for at least a week prior to your surgery.  Please be advised that the combination of cocaine and anesthesia may have negative outcomes, up to and including death. If you test positive for cocaine, your surgery will be cancelled.  On the morning of surgery brush your teeth with toothpaste and water, you may rinse your mouth with mouthwash if you wish. Do not swallow any toothpaste or mouthwash.  Do not wear jewelry, make-up, hairpins, clips or nail polish.  Do not wear lotions, powders, or perfumes.   Do not shave body from the neck down 48 hours prior to surgery just in case you cut yourself which could leave a site for infection.  Also, freshly shaved skin may become irritated if using the CHG soap.  Contact lenses, hearing aids and dentures may not be worn into surgery.  Do not bring valuables to the hospital. [facility] is not responsible for any missing/lost belongings or valuables.   Use CHG Soap or wipes as directed on instruction sheet.  Notify your doctor if there is any change in your medical condition (cold, fever, infection).  Wear comfortable clothing (specific to your surgery type) to the hospital.  Plan for stool softeners for home use; pain medications have a tendency to cause constipation. You can also help prevent constipation by eating foods high in fiber such as fruits and vegetables and drinking plenty of fluids as your diet allows.  After surgery, you can help prevent lung complications by  doing breathing exercises.  Take deep breaths and cough every 1-2 hours. Your doctor may order a device called an Incentive Spirometer to help you take deep breaths. When coughing or sneezing, hold a pillow firmly against your incision with both hands. This is called splinting. Doing this helps protect your incision. It also decreases belly discomfort.  If you are being admitted to the hospital overnight, leave  your suitcase in the car. After surgery it may be brought to your room.  If you are being discharged the day of surgery, you will not be allowed to drive home. You will need a responsible adult (18 years or older) to drive you home and stay with you that night.   If you are taking public transportation, you will need to have a responsible adult (18 years or older) with you. Please confirm with your physician that it is acceptable to use public transportation.   Please call the Pre-admissions Testing Dept. at [phone] if you have any questions about these instructions.  Visitation Policy:  Patients undergoing a surgery or procedure may have one family member or support person with them as long as that person is not COVID-19 positive or experiencing its symptoms.  That person may remain in the waiting area during the procedure.  Inpatient Visitation Update:   In an effort to ensure the safety of our team members and our patients, we are implementing a change to our visitation policy:  Effective Monday, Aug. 9, at 7 a.m., inpatients will be allowed one support person.  o The support person may change daily.  o The support person must pass our screening, gel in and out, and wear a mask at all times, including in the patients room.  o Patients must also wear a mask when staff or their support person are in the room.  o Masking is required regardless of vaccination status.  Systemwide, no visitors 17 or younger.

## 2020-03-13 ENCOUNTER — Other Ambulatory Visit
Admission: RE | Admit: 2020-03-13 | Discharge: 2020-03-13 | Disposition: A | Discharge status: Home / Self Care | Payer: No Typology Code available for payment source | Source: Ambulatory Visit | Attending: Surgery | Admitting: Surgery

## 2020-03-13 ENCOUNTER — Other Ambulatory Visit: Payer: Self-pay

## 2020-03-13 DIAGNOSIS — Z01812 Encounter for preprocedural laboratory examination: Secondary | ICD-10-CM | POA: Insufficient documentation

## 2020-03-13 DIAGNOSIS — Z20822 Contact with and (suspected) exposure to covid-19: Secondary | ICD-10-CM | POA: Diagnosis not present

## 2020-03-13 LAB — SARS CORONAVIRUS 2 (TAT 6-24 HRS): SARS Coronavirus 2: NEGATIVE

## 2020-03-15 ENCOUNTER — Ambulatory Visit
Admission: RE | Admit: 2020-03-15 | Discharge: 2020-03-15 | Disposition: A | Discharge status: Home / Self Care | Payer: No Typology Code available for payment source | Attending: Surgery | Admitting: Surgery

## 2020-03-15 ENCOUNTER — Encounter
Admission: RE | Disposition: A | Discharge status: Home / Self Care | Payer: Self-pay | Source: Home / Self Care | Attending: Surgery

## 2020-03-15 ENCOUNTER — Ambulatory Visit: Payer: No Typology Code available for payment source | Admitting: Certified Registered Nurse Anesthetist

## 2020-03-15 ENCOUNTER — Encounter: Payer: Self-pay | Admitting: Surgery

## 2020-03-15 ENCOUNTER — Other Ambulatory Visit: Payer: Self-pay

## 2020-03-15 DIAGNOSIS — M7712 Lateral epicondylitis, left elbow: Secondary | ICD-10-CM | POA: Diagnosis present

## 2020-03-15 DIAGNOSIS — Z87891 Personal history of nicotine dependence: Secondary | ICD-10-CM | POA: Insufficient documentation

## 2020-03-15 HISTORY — PX: TENNIS ELBOW RELEASE/NIRSCHEL PROCEDURE: SHX6651

## 2020-03-15 SURGERY — TENNIS ELBOW RELEASE/NIRSCHEL PROCEDURE
Anesthesia: General | Site: Elbow | Laterality: Left

## 2020-03-15 MED ORDER — FENTANYL CITRATE (PF) 100 MCG/2ML IJ SOLN: 25.0000 ug | INTRAMUSCULAR | Status: DC | PRN

## 2020-03-15 MED ORDER — LIDOCAINE HCL (CARDIAC) PF 100 MG/5ML IV SOSY
PREFILLED_SYRINGE | INTRAVENOUS | Status: DC | PRN
  Administered 2020-03-15: 80 mg via INTRAVENOUS

## 2020-03-15 MED ORDER — FENTANYL CITRATE (PF) 100 MCG/2ML IJ SOLN
INTRAMUSCULAR | Status: AC
  Filled 2020-03-15: qty 2

## 2020-03-15 MED ORDER — HYDROCODONE-ACETAMINOPHEN 5-325 MG PO TABS
1.0000 | ORAL_TABLET | Freq: Four times a day (QID) | ORAL | 0 refills | Status: AC | PRN
Start: 2020-03-15 — End: 2021-03-15

## 2020-03-15 MED ORDER — PROPOFOL 10 MG/ML IV BOLUS
INTRAVENOUS | Status: AC
  Filled 2020-03-15: qty 40

## 2020-03-15 MED ORDER — FENTANYL CITRATE (PF) 100 MCG/2ML IJ SOLN
INTRAMUSCULAR | Status: DC | PRN
  Administered 2020-03-15: 25 ug via INTRAVENOUS
  Administered 2020-03-15: 50 ug via INTRAVENOUS
  Administered 2020-03-15: 25 ug via INTRAVENOUS

## 2020-03-15 MED ORDER — CEFAZOLIN SODIUM-DEXTROSE 2-4 GM/100ML-% IV SOLN
INTRAVENOUS | Status: AC
  Filled 2020-03-15: qty 100

## 2020-03-15 MED ORDER — ACETAMINOPHEN 10 MG/ML IV SOLN
INTRAVENOUS | Status: DC | PRN
  Administered 2020-03-15: 1000 mg via INTRAVENOUS

## 2020-03-15 MED ORDER — CEFAZOLIN SODIUM-DEXTROSE 2-4 GM/100ML-% IV SOLN
2.0000 g | INTRAVENOUS | Status: AC
  Administered 2020-03-15: 2 g via INTRAVENOUS

## 2020-03-15 MED ORDER — FAMOTIDINE 20 MG PO TABS
20.0000 mg | ORAL_TABLET | Freq: Once | ORAL | Status: AC
  Administered 2020-03-15: 20 mg via ORAL

## 2020-03-15 MED ORDER — BUPIVACAINE HCL (PF) 0.5 % IJ SOLN
INTRAMUSCULAR | Status: DC | PRN
  Administered 2020-03-15: 10 mL

## 2020-03-15 MED ORDER — BUPIVACAINE HCL (PF) 0.5 % IJ SOLN
INTRAMUSCULAR | Status: AC
  Filled 2020-03-15: qty 30

## 2020-03-15 MED ORDER — CHLORHEXIDINE GLUCONATE 0.12 % MT SOLN
15.0000 mL | Freq: Once | OROMUCOSAL | Status: AC
  Administered 2020-03-15: 15 mL via OROMUCOSAL

## 2020-03-15 MED ORDER — ONDANSETRON HCL 4 MG/2ML IJ SOLN
INTRAMUSCULAR | Status: AC
  Filled 2020-03-15: qty 2

## 2020-03-15 MED ORDER — ONDANSETRON HCL 4 MG/2ML IJ SOLN: 4.0000 mg | Freq: Four times a day (QID) | INTRAMUSCULAR | Status: DC | PRN

## 2020-03-15 MED ORDER — DEXAMETHASONE SODIUM PHOSPHATE 10 MG/ML IJ SOLN
INTRAMUSCULAR | Status: AC
  Filled 2020-03-15: qty 1

## 2020-03-15 MED ORDER — POTASSIUM CHLORIDE IN NACL 20-0.9 MEQ/L-% IV SOLN
INTRAVENOUS | Status: DC
  Filled 2020-03-15 (×3): qty 1000

## 2020-03-15 MED ORDER — ONDANSETRON HCL 4 MG/2ML IJ SOLN: 4.0000 mg | Freq: Once | INTRAMUSCULAR | Status: DC | PRN

## 2020-03-15 MED ORDER — MIDAZOLAM HCL 2 MG/2ML IJ SOLN
INTRAMUSCULAR | Status: AC
  Filled 2020-03-15: qty 2

## 2020-03-15 MED ORDER — LIDOCAINE HCL (PF) 2 % IJ SOLN
INTRAMUSCULAR | Status: AC
  Filled 2020-03-15: qty 5

## 2020-03-15 MED ORDER — HYDROCODONE-ACETAMINOPHEN 5-325 MG PO TABS
ORAL_TABLET | ORAL | Status: AC
  Filled 2020-03-15: qty 2

## 2020-03-15 MED ORDER — FAMOTIDINE 20 MG PO TABS
ORAL_TABLET | ORAL | Status: AC
  Filled 2020-03-15: qty 1

## 2020-03-15 MED ORDER — FENTANYL CITRATE (PF) 100 MCG/2ML IJ SOLN
25.0000 ug | INTRAMUSCULAR | Status: DC | PRN
  Administered 2020-03-15 (×3): 25 ug via INTRAVENOUS

## 2020-03-15 MED ORDER — ORAL CARE MOUTH RINSE: 15.0000 mL | Freq: Once | OROMUCOSAL | Status: AC

## 2020-03-15 MED ORDER — MIDAZOLAM HCL 2 MG/2ML IJ SOLN
INTRAMUSCULAR | Status: DC | PRN
  Administered 2020-03-15: 2 mg via INTRAVENOUS

## 2020-03-15 MED ORDER — ONDANSETRON HCL 4 MG PO TABS: 4.0000 mg | ORAL_TABLET | Freq: Four times a day (QID) | ORAL | Status: DC | PRN

## 2020-03-15 MED ORDER — ACETAMINOPHEN 10 MG/ML IV SOLN
INTRAVENOUS | Status: AC
  Filled 2020-03-15: qty 100

## 2020-03-15 MED ORDER — METOCLOPRAMIDE HCL 5 MG/ML IJ SOLN: 5.0000 mg | Freq: Three times a day (TID) | INTRAMUSCULAR | Status: DC | PRN

## 2020-03-15 MED ORDER — ONDANSETRON HCL 4 MG/2ML IJ SOLN
INTRAMUSCULAR | Status: DC | PRN
  Administered 2020-03-15: 4 mg via INTRAVENOUS

## 2020-03-15 MED ORDER — CHLORHEXIDINE GLUCONATE 0.12 % MT SOLN
OROMUCOSAL | Status: AC
  Filled 2020-03-15: qty 15

## 2020-03-15 MED ORDER — LACTATED RINGERS IV SOLN: INTRAVENOUS | Status: DC

## 2020-03-15 MED ORDER — METOCLOPRAMIDE HCL 10 MG PO TABS: 5.0000 mg | ORAL_TABLET | Freq: Three times a day (TID) | ORAL | Status: DC | PRN

## 2020-03-15 MED ORDER — KETOROLAC TROMETHAMINE 30 MG/ML IJ SOLN
INTRAMUSCULAR | Status: DC | PRN
  Administered 2020-03-15: 30 mg via INTRAVENOUS

## 2020-03-15 MED ORDER — PROPOFOL 10 MG/ML IV BOLUS
INTRAVENOUS | Status: DC | PRN
  Administered 2020-03-15 (×2): 20 mg via INTRAVENOUS
  Administered 2020-03-15: 200 mg via INTRAVENOUS

## 2020-03-15 MED ORDER — HYDROCODONE-ACETAMINOPHEN 5-325 MG PO TABS
1.0000 | ORAL_TABLET | ORAL | Status: DC | PRN
  Administered 2020-03-15: 2 via ORAL

## 2020-03-15 MED ORDER — DEXAMETHASONE SODIUM PHOSPHATE 10 MG/ML IJ SOLN
INTRAMUSCULAR | Status: DC | PRN
  Administered 2020-03-15: 5 mg via INTRAVENOUS

## 2020-03-15 SURGICAL SUPPLY — 55 items
ANCH SUT 1 SHRT SM RGD INSRTR (Anchor) ×1 IMPLANT
ANCHOR SUT 1.45 SZ 1 SHORT (Anchor) ×3 IMPLANT
APL PRP STRL LF DISP 70% ISPRP (MISCELLANEOUS) ×1
BIT DRILL JUGRKNT W/NDL BIT2.9 (DRILL) IMPLANT
BLADE SURG SZ10 CARB STEEL (BLADE) ×3 IMPLANT
BNDG COHESIVE 4X5 TAN STRL (GAUZE/BANDAGES/DRESSINGS) ×3 IMPLANT
BNDG ELASTIC 4X5.8 VLCR STR LF (GAUZE/BANDAGES/DRESSINGS) ×3 IMPLANT
BNDG ESMARK 4X12 TAN STRL LF (GAUZE/BANDAGES/DRESSINGS) ×3 IMPLANT
CANISTER SUCT 1200ML W/VALVE (MISCELLANEOUS) ×3 IMPLANT
CHLORAPREP W/TINT 26 (MISCELLANEOUS) ×3 IMPLANT
CLOSURE WOUND 1/4X4 (GAUZE/BANDAGES/DRESSINGS) ×1
CORD BIP STRL DISP 12FT (MISCELLANEOUS) IMPLANT
COVER WAND RF STERILE (DRAPES) ×3 IMPLANT
CUFF TOURN SGL QUICK 18X4 (TOURNIQUET CUFF) IMPLANT
CUFF TOURN SGL QUICK 24 (TOURNIQUET CUFF) ×3
CUFF TOURN SGL QUICK 30 (TOURNIQUET CUFF)
CUFF TRNQT CYL 24X4X16.5-23 (TOURNIQUET CUFF) ×1 IMPLANT
CUFF TRNQT CYL 30X4X21-28X (TOURNIQUET CUFF) IMPLANT
DRAPE SURG 17X11 SM STRL (DRAPES) ×3 IMPLANT
DRILL JUGGERKNOT W/NDL BIT 2.9 (DRILL)
ELECT REM PT RETURN 9FT ADLT (ELECTROSURGICAL) ×3
ELECTRODE REM PT RTRN 9FT ADLT (ELECTROSURGICAL) ×1 IMPLANT
FORCEPS JEWEL BIP 4-3/4 STR (INSTRUMENTS) IMPLANT
GAUZE SPONGE 4X4 12PLY STRL (GAUZE/BANDAGES/DRESSINGS) ×3 IMPLANT
GAUZE XEROFORM 1X8 LF (GAUZE/BANDAGES/DRESSINGS) ×3 IMPLANT
GLOVE BIO SURGEON STRL SZ8 (GLOVE) ×6 IMPLANT
GLOVE INDICATOR 8.0 STRL GRN (GLOVE) ×3 IMPLANT
GOWN STRL REUS W/ TWL LRG LVL3 (GOWN DISPOSABLE) ×1 IMPLANT
GOWN STRL REUS W/ TWL XL LVL3 (GOWN DISPOSABLE) ×1 IMPLANT
GOWN STRL REUS W/TWL LRG LVL3 (GOWN DISPOSABLE) ×3
GOWN STRL REUS W/TWL XL LVL3 (GOWN DISPOSABLE) ×3
KIT TURNOVER KIT A (KITS) ×3 IMPLANT
LABEL OR SOLS (LABEL) ×3 IMPLANT
LOOP RED MAXI  1X406MM (MISCELLANEOUS) ×2
LOOP VESSEL MAXI 1X406 RED (MISCELLANEOUS) ×1 IMPLANT
MANIFOLD NEPTUNE II (INSTRUMENTS) ×3 IMPLANT
NDL SAFETY ECLIPSE 18X1.5 (NEEDLE) IMPLANT
NEEDLE HYPO 18GX1.5 SHARP (NEEDLE)
NS IRRIG 1000ML POUR BTL (IV SOLUTION) ×3 IMPLANT
PACK EXTREMITY (MISCELLANEOUS) ×3 IMPLANT
PAD CAST CTTN 4X4 STRL (SOFTGOODS) IMPLANT
PADDING CAST COTTON 4X4 STRL (SOFTGOODS)
SPLINT WRIST LG LT TX990309 (SOFTGOODS) ×3 IMPLANT
SPONGE LAP 18X18 RF (DISPOSABLE) ×3 IMPLANT
STAPLER SKIN PROX 35W (STAPLE) ×3 IMPLANT
STOCKINETTE IMPERVIOUS 9X36 MD (GAUZE/BANDAGES/DRESSINGS) ×3 IMPLANT
STRAP SAFETY 5IN WIDE (MISCELLANEOUS) ×3 IMPLANT
STRIP CLOSURE SKIN 1/4X4 (GAUZE/BANDAGES/DRESSINGS) ×2 IMPLANT
SUT PROLENE 4 0 PS 2 18 (SUTURE) ×3 IMPLANT
SUT VIC AB 0 CT2 27 (SUTURE) ×3 IMPLANT
SUT VIC AB 2-0 CT1 (SUTURE) ×3 IMPLANT
SUT VIC AB 4-0 SH 27 (SUTURE) ×3
SUT VIC AB 4-0 SH 27XANBCTRL (SUTURE) ×1 IMPLANT
SUT VICRYL+ 3-0 36IN CT-1 (SUTURE) ×3 IMPLANT
SYR 10ML LL (SYRINGE) ×3 IMPLANT

## 2020-03-15 NOTE — H&P (Signed)
History of Present Illness:  Perry Martin is a 34 y.o. male who presents for evaluation and treatment of his lateral sided left elbow pain. The patient notes that the symptoms developed almost a year ago and developed without any specific cause or injury. He saw Perry Neighbor, PA-C, who gave him a steroid injection in March which he states provided substantial relief, lasting nearly 6 months before his symptoms began to recur. He received a second injection in October which provided little if any relief of his symptoms. Therefore, the patient was sent for an MRI scan of the left elbow and referred to me for further evaluation and treatment. The patient notes that his symptoms are aggravated by any repetitive activities as well as with grasping/gripping activities. He denies any numbness or paresthesias down his arm to his hand. The patient is status post an open debridement of his right lateral elbow for similar symptoms 2 years ago from which he has done quite well.  No current Epic-ordered outpatient medications on file.   Allergies: No Known Allergies  Past Medical History:  . Allergic rhinitis, seasonal 02/14/2015  Mild seasonal, takes over the counter meds  . H/O diverticulitis of colon 02/14/2015  . Impingement syndrome of right shoulder 02/14/2015  . Mild obesity, unspecified 02/14/2015   Past Surgical History:  . Debridement/ repair of common extensor origin right elbow Right 03/11/2018 Dr.Jeury Martin  . robotic sigmoid resection complicated by intra-abdominal hematoma evacuated 02/2014   Family History:  . No Known Problems Mother  . Diabetes type II Father  . Skin cancer Father  . Depression Father  . No Known Problems Brother  . Diabetes type II Maternal Grandmother  . Hyperlipidemia (Elevated cholesterol) Maternal Grandmother  . High blood pressure (Hypertension) Maternal Grandmother  . Lung cancer Maternal Grandfather  . Emphysema Maternal Grandfather  . Breast cancer  Paternal Grandmother  . Throat cancer Paternal Grandfather  . No Known Problems Daughter  . No Known Problems Son  . No Known Problems Brother   Social History:   Socioeconomic History:  [name] Kitchen Marital status: Married  Spouse name: Not on file  . Number of children: Not on file  . Years of education: 30  . Highest education level: Not on file  Occupational History  . Occupation: [employer] fast [employer]er  . Occupation: junk removal  Tobacco Use  . Smoking status: Former Smoker  Packs/day: 0.25  Years: 2.00  Pack years: 0.50  Quit date: 04/09/2013  Years since quitting: 6.9  . Smokeless tobacco: Never Used  Vaping Use  . Vaping Use: Never used  Substance and Sexual Activity  . Alcohol use: Yes  Comment: occasionally  . Drug use: No  . Sexual activity: Yes  Partners: Female  Birth control/protection: Pill  Other Topics Concern  . Not on file  Social History Narrative  Lives with wife, expecting first child 4/17. 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 Has guns but keeps locked up.   Social Determinants of Health:   Physicist, medical Strain: Not on file  Food Insecurity: Not on file  Transportation Needs: Not on file   Review of Systems:  A comprehensive 14 point ROS was performed, reviewed, and the pertinent orthopaedic findings are documented in the HPI.  Physical Exam: Vitals:  03/06/20 0846  BP: 124/80  Weight: (!) 119.3 kg (263 lb)  Height: 186.7 cm (6' 1.5")  PainSc: 2  PainLoc: Elbow   General/Constitutional:  Pleasant overweight young male in no acute distress. Neuro/Psych: Normal mood and affect, oriented to person, place and time. Eyes: Non-icteric. Pupils are equal, round, and reactive to light, and exhibit synchronous movement. ENT: Unremarkable. Lymphatic: No palpable adenopathy. Respiratory: Lungs clear to auscultation, Normal chest excursion, No wheezes and Non-labored  breathing Cardiovascular: Regular rate and rhythm. No murmurs. and No edema, swelling or tenderness, except as noted in detailed exam. Integumentary: No impressive skin lesions present, except as noted in detailed exam. Musculoskeletal: Unremarkable, except as noted in detailed exam.  Left elbow exam: Skin inspection of the left elbow is unremarkable. No swelling, erythema, ecchymosis, abrasions, or other skin abnormalities are identified. He has mild tenderness to palpation over the common extensor origin just distal to the lateral epicondyle. He exhibits full active and passive range of motion of the elbow without any pain or catching. There is no elbow effusion. His lateral elbow pain is mildly reproduced with resisted wrist extension, as well as with maximal passive wrist flexion with the elbow extended. He is neurovascularly intact to the left forearm and hand.  X-rays/MRI/Lab data:  A recent MRI scan of the left elbow has been obtained and is available for review. By report, the scan demonstrates evidence of "mild common forearm extensor tendinosis with small intrasubstance tear." No bony or ligamentous pathology is noted. Both the films and report were reviewed by myself and discussed with the patient.  Assessment: Lateral epicondylitis, left elbow.   Plan: The treatment options were discussed with the patient. In addition, patient educational materials were provided regarding the diagnosis and treatment options. The patient is quite frustrated by his symptoms and functional limitations, and would like to proceed with more aggressive treatment options. Therefore, I have recommended a surgical procedure, specifically an open debridement of the common extensor origin of the left elbow. The procedure was discussed with the patient, as were the potential risks (including bleeding, infection, nerve and/or blood vessel injury, persistent or recurrent pain, weakness, need for further surgery, blood  clots, strokes, heart attacks and/or arhythmias, pneumonia, etc.) and benefits. The patient states his/her understanding and wishes to proceed. All of the patient's questions and concerns were answered. He can call any time with further concerns. He will follow up post-surgery, routine.   H&P reviewed and patient re-examined. No changes.

## 2020-03-15 NOTE — Anesthesia Postprocedure Evaluation (Signed)
Anesthesia Post Note  Patient: Perry Martin  Procedure(s) Performed: DEBRIDEMENT OF THE COMMON EXTENSOR ORIGIN OF LEFT ELBOW (Left Elbow)  Patient location during evaluation: PACU Anesthesia Type: General Level of consciousness: awake and alert Pain management: pain level controlled Vital Signs Assessment: post-procedure vital signs reviewed and stable Respiratory status: spontaneous breathing, nonlabored ventilation, respiratory function stable and patient connected to nasal cannula oxygen Cardiovascular status: blood pressure returned to baseline and stable Postop Assessment: no apparent nausea or vomiting Anesthetic complications: no   No complications documented.   Last Vitals:  Vitals:   03/15/20 1405 03/15/20 1414  BP: 116/85 119/76  Pulse: 72 69  Resp: 17 16  Temp: 36.5 C (!) 36.3 C  SpO2: 97% 97%    Last Pain:  Vitals:   03/15/20 1414  TempSrc: Temporal  PainSc: 3                  Yevette Edwards

## 2020-03-15 NOTE — Op Note (Signed)
03/15/2020  1:19 PM  Patient:   AHLIJAH RAIA  Pre-Op Diagnosis:   Chronic lateral epicondylitis, left elbow.  Post-Op Diagnosis:   Same.  Procedure:   Debridement/repair of common extensor origin, left elbow.  Surgeon:   Maryagnes Amos, MD  Assistant:   None  Anesthesia:   General LMA  Findings:   As above.  Complications:   None  EBL:   0 cc  Fluids:   800 cc crystalloid  TT:   26 minutes at 250 mmHg  Drains:   None  Closure:   2-0 Vicryl subcuticular sutures  Implants:   Biomet JuggerKnot 1.4 mm anchor x1  Brief Clinical Note:   The patient is a 34 year old male with an approximately 1 year history of gradually worsening lateral sided left elbow pain. His symptoms have progressed despite medications, activity modification, etc. His history and examination consistent with chronic lateral epicondylitis. The patient presents at this time for debridement and repair of the common extensor origin of his left elbow.  Procedure:   The patient was brought into the operating room and lain in the supine position. After adequate general laryngal mask anesthesia was obtained, the patient's left upper extremity was prepped with ChloraPrep solution before being draped sterilely. Preoperative antibiotics were administered. A timeout was performed to verify the appropriate surgical site before the limb was exsanguinated with an Esmarch and the tourniquet inflated to 250 mmHg.   An approximately 4-5 cm incision was made over the lateral aspect of the elbow beginning at the lateral epicondyle and extending distally in line with the common extensor origin tendons. The incision was carried down through the subcutaneous tissues to expose the common extensor origin. The extensor carpi radialis brevis tendon was identified and incised in line with the incision. The areas of significantly degenerative tendinous tissues were debrided sharply with a #15 blade down to the epicondylar region. The  bone itself was roughened with a rongeur to provide a good bleeding surface for reattachment of the tendon. A Biomet 1.4 mm JuggerKnot anchor was inserted into the exposed bone of the lateral epicondyle. The sutures were passed through the tendon and tied securely to effect the repair.   The wound was copiously irrigated with bacitracin saline solution using bulb irrigation before several 2-0 Vicryl interrupted sutures were used to close the longitudinal incision in the tendon in a side-to-side fashion. The subcutaneous tissues were closed using 2-0 Vicryl interrupted sutures before the skin was closed using 2-0 Vicryl subcuticular sutures. Benzoin and Steri-Strips were applied to the skin. A total of 10 cc of 0.5% plain Sensorcaine was injected in and around the incision to help with postoperative analgesia before a sterile bulky dressing was applied to the elbow. A Velcro wrist immobilizer was applied before the patient was awakened, extubated, and returned to the recovery room in satisfactory condition after tolerating the procedure well.

## 2020-03-15 NOTE — Anesthesia Procedure Notes (Signed)
Procedure Name: LMA Insertion Date/Time: 03/15/2020 12:25 PM Performed by: Henrietta Hoover, CRNA Pre-anesthesia Checklist: Patient identified, Emergency Drugs available, Suction available and Patient being monitored Patient Re-evaluated:Patient Re-evaluated prior to induction Oxygen Delivery Method: Circle system utilized Preoxygenation: Pre-oxygenation with 100% oxygen Induction Type: IV induction Ventilation: Mask ventilation without difficulty LMA: LMA inserted LMA Size: 4.0 Number of attempts: 1 Placement Confirmation: positive ETCO2 and breath sounds checked- equal and bilateral Tube secured with: Tape Dental Injury: Teeth and Oropharynx as per pre-operative assessment

## 2020-03-15 NOTE — Discharge Instructions (Addendum)
Orthopedic discharge instructions: Keep dressing dry and intact. Keep hand elevated above heart level. May shower after dressing removed on postop day 4 (Sunday). Cover staples with Band-Aids after drying off, then re-apply velcro wrist splint. Apply ice to affected area frequently. Take ibuprofen 600-800 mg TID with meals for 7-10 days, then as necessary.   TID = three times per day (every 8 hours) Take ES Tylenol or pain medication as prescribed when needed.  Return for follow-up in 10-14 days or as scheduled.   AMBULATORY SURGERY  DISCHARGE INSTRUCTIONS   1) The drugs that you were given will stay in your system until tomorrow so for the next 24 hours you should not:  A) Drive an automobile B) Make any legal decisions C) Drink any alcoholic beverage   2) You may resume regular meals tomorrow.  Today it is better to start with liquids and gradually work up to solid foods.  You may eat anything you prefer, but it is better to start with liquids, then soup and crackers, and gradually work up to solid foods.   3) Please notify your doctor immediately if you have any unusual bleeding, trouble breathing, redness and pain at the surgery site, drainage, fever, or pain not relieved by medication.    4) Additional Instructions:   Please contact your physician with any problems or Same Day Surgery at [phone], Monday through Friday 6 am to 4 pm, or  at [facility] number at [phone].

## 2020-03-15 NOTE — Transfer of Care (Signed)
Immediate Anesthesia Transfer of Care Note  Patient: Perry Martin  Procedure(s) Performed: DEBRIDEMENT OF THE COMMON EXTENSOR ORIGIN OF LEFT ELBOW (Left Elbow)  Patient Location: PACU  Anesthesia Type:General  Level of Consciousness: awake, drowsy and patient cooperative  Airway & Oxygen Therapy: Patient Spontanous Breathing  Post-op Assessment: Report given to RN and Post -op Vital signs reviewed and stable  Post vital signs: Reviewed and stable  Last Vitals:  Vitals Value Taken Time  BP 118/75 03/15/20 1321  Temp 36.4 C 03/15/20 1321  Pulse 80 03/15/20 1323  Resp 15 03/15/20 1323  SpO2 99 % 03/15/20 1323  Vitals shown include unvalidated device data.  Last Pain:  Vitals:   03/15/20 1118  TempSrc: Temporal  PainSc: 0-No pain         Complications: No complications documented.

## 2020-03-15 NOTE — Anesthesia Preprocedure Evaluation (Signed)
Anesthesia Evaluation  Patient identified by MRN, date of birth, ID band Patient awake    Reviewed: Allergy & Precautions, H&P , NPO status , Patient's Chart, lab work & pertinent test results, reviewed documented beta blocker date and time   History of Anesthesia Complications (+) Family history of anesthesia reaction and history of anesthetic complications  Airway Mallampati: II  TM Distance: >3 FB Neck ROM: full    Dental  (+) Teeth Intact   Pulmonary neg pulmonary ROS, former smoker,    Pulmonary exam normal        Cardiovascular Exercise Tolerance: Good negative cardio ROS Normal cardiovascular exam Rate:Normal     Neuro/Psych negative neurological ROS  negative psych ROS   GI/Hepatic negative GI ROS, Neg liver ROS,   Endo/Other  negative endocrine ROS  Renal/GU negative Renal ROS  negative genitourinary   Musculoskeletal   Abdominal   Peds  Hematology negative hematology ROS (+)   Anesthesia Other Findings   Reproductive/Obstetrics negative OB ROS                             Anesthesia Physical Anesthesia Plan  ASA: II  Anesthesia Plan: General LMA   Post-op Pain Management:    Induction:   PONV Risk Score and Plan: 3  Airway Management Planned:   Additional Equipment:   Intra-op Plan:   Post-operative Plan:   Informed Consent: I have reviewed the patients History and Physical, chart, labs and discussed the procedure including the risks, benefits and alternatives for the proposed anesthesia with the patient or authorized representative who has indicated his/her understanding and acceptance.       Plan Discussed with: CRNA  Anesthesia Plan Comments:         Anesthesia Quick Evaluation

## 2021-11-20 DIAGNOSIS — M25511 Pain in right shoulder: Secondary | ICD-10-CM | POA: Insufficient documentation

## 2022-01-18 DIAGNOSIS — M7581 Other shoulder lesions, right shoulder: Secondary | ICD-10-CM | POA: Insufficient documentation

## 2022-02-19 ENCOUNTER — Other Ambulatory Visit: Payer: Self-pay | Admitting: Surgery

## 2022-02-19 DIAGNOSIS — M7581 Other shoulder lesions, right shoulder: Secondary | ICD-10-CM

## 2022-02-27 ENCOUNTER — Ambulatory Visit
Admission: RE | Admit: 2022-02-27 | Discharge: 2022-02-27 | Disposition: A | Discharge status: Home / Self Care | Payer: BC Managed Care – PPO | Source: Ambulatory Visit | Attending: Surgery | Admitting: Surgery

## 2022-02-27 DIAGNOSIS — M25511 Pain in right shoulder: Secondary | ICD-10-CM | POA: Insufficient documentation

## 2022-02-27 DIAGNOSIS — M7581 Other shoulder lesions, right shoulder: Secondary | ICD-10-CM | POA: Insufficient documentation

## 2022-02-27 DIAGNOSIS — M67813 Other specified disorders of tendon, right shoulder: Secondary | ICD-10-CM | POA: Diagnosis not present

## 2022-02-27 MED ORDER — LIDOCAINE HCL (PF) 1 % IJ SOLN
10.0000 mL | Freq: Once | INTRAMUSCULAR | Status: AC
  Administered 2022-02-27: 10 mL

## 2022-02-27 MED ORDER — SODIUM CHLORIDE (PF) 0.9 % IJ SOLN
20.0000 mL | INTRAMUSCULAR | Status: DC | PRN
  Administered 2022-02-27: 5 mL

## 2022-02-27 MED ORDER — IOHEXOL 180 MG/ML  SOLN
20.0000 mL | Freq: Once | INTRAMUSCULAR | Status: AC | PRN
  Administered 2022-02-27: 15 mL

## 2022-02-27 MED ORDER — GADOBUTROL 1 MMOL/ML IV SOLN
2.0000 mL | Freq: Once | INTRAVENOUS | Status: AC | PRN
  Administered 2022-02-27: 0.05 mL

## 2022-03-11 ENCOUNTER — Other Ambulatory Visit: Payer: Self-pay | Admitting: Surgery

## 2022-03-11 ENCOUNTER — Other Ambulatory Visit: Payer: No Typology Code available for payment source

## 2022-03-20 ENCOUNTER — Encounter
Admission: RE | Admit: 2022-03-20 | Discharge: 2022-03-20 | Disposition: A | Discharge status: Home / Self Care | Payer: BC Managed Care – PPO | Source: Ambulatory Visit | Attending: Surgery | Admitting: Surgery

## 2022-03-20 ENCOUNTER — Other Ambulatory Visit: Payer: Self-pay

## 2022-03-20 NOTE — Patient Instructions (Addendum)
Your procedure is scheduled on: 03/27/22 - Wednesday Report to the Registration Desk on the 1st floor of the [facility]. To find out your arrival time, please call [phone] between 1PM - 3PM on: 03/25/22 - Tuesday If your arrival time is 6:00 am, do not arrive prior to that time as the [facility] entrance doors do not open until 6:00 am.  REMEMBER: Instructions that are not followed completely may result in serious medical risk, up to and including death; or upon the discretion of your surgeon and anesthesiologist your surgery may need to be rescheduled.  Do not eat food after midnight the night before surgery.  No gum chewing, lozengers or hard candies.  You may however, drink CLEAR liquids up to 2 hours before you are scheduled to arrive for your surgery. Do not drink anything within 2 hours of your scheduled arrival time.  Clear liquids include: - water  - apple juice without pulp - gatorade (not RED colors) - black coffee or tea (Do NOT add milk or creamers to the coffee or tea) Do NOT drink anything that is not on this list.  TAKE THESE MEDICATIONS THE MORNING OF SURGERY WITH A SIP OF WATER: NONE  One week prior to surgery: Stop Anti-inflammatories (NSAIDS) such as Advil, Aleve, Ibuprofen, Motrin, Naproxen, Naprosyn and Aspirin based products such as Excedrin, Goodys Powder, BC Powder.  Stop ANY OVER THE COUNTER supplements until after surgery.  You may however, continue to take Tylenol if needed for pain up until the day of surgery.  No Alcohol for 24 hours before or after surgery.  No Smoking including e-cigarettes for 24 hours prior to surgery.  No chewable tobacco products for at least 6 hours prior to surgery.  No nicotine patches on the day of surgery.  Do not use any "recreational" drugs for at least a week prior to your surgery.  Please be advised that the combination of cocaine and anesthesia may have negative outcomes, up to and including death. If you  test positive for cocaine, your surgery will be cancelled.  On the morning of surgery brush your teeth with toothpaste and water, you may rinse your mouth with mouthwash if you wish. Do not swallow any toothpaste or mouthwash.  Use CHG Soap or wipes as directed on instruction sheet.  Do not wear jewelry, make-up, hairpins, clips or nail polish.  Do not wear lotions, powders, or perfumes.   Do not shave body from the neck down 48 hours prior to surgery just in case you cut yourself which could leave a site for infection.  Also, freshly shaved skin may become irritated if using the CHG soap.  Contact lenses, hearing aids and dentures may not be worn into surgery.  Do not bring valuables to the hospital. [facility] is not responsible for any missing/lost belongings or valuables.   Notify your doctor if there is any change in your medical condition (cold, fever, infection).  Wear comfortable clothing (specific to your surgery type) to the hospital.  After surgery, you can help prevent lung complications by doing breathing exercises.  Take deep breaths and cough every 1-2 hours. Your doctor may order a device called an Incentive Spirometer to help you take deep breaths. When coughing or sneezing, hold a pillow firmly against your incision with both hands. This is called "splinting." Doing this helps protect your incision. It also decreases belly discomfort.  If you are being admitted to the hospital overnight, leave your suitcase in the car.  After surgery it may be brought to your room.  If you are being discharged the day of surgery, you will not be allowed to drive home. You will need a responsible adult (18 years or older) to drive you home and stay with you that night.   If you are taking public transportation, you will need to have a responsible adult (18 years or older) with you. Please confirm with your physician that it is acceptable to use public transportation.   Please call  the Pre-admissions Testing Dept. at [phone] if you have any questions about these instructions.  Surgery Visitation Policy:  Patients undergoing a surgery or procedure may have two family members or support persons with them as long as the person is not COVID-19 positive or experiencing its symptoms.   Inpatient Visitation:    Visiting hours are 7 a.m. to 8 p.m. Up to four visitors are allowed at one time in a patient room. The visitors may rotate out with other people during the day. One designated support person (adult) may remain overnight.  MASKING: Due to an increase in RSV rates and hospitalizations, starting Wednesday, Nov. 15, in patient care areas in which we serve newborns, infants and children, masks will be required for teammates and visitors.  Children ages 61 and under may not visit. This policy affects the following departments only:  [facility] Postpartum area Mother Baby Unit Newborn nursery/Special care nursery  Other areas: Masks continue to be strongly recommended for patient-facing teammates, visitors and patients in all other areas. Visitation is not restricted outside of the units listed above.

## 2022-03-26 MED ORDER — FAMOTIDINE 20 MG PO TABS: 20.0000 mg | ORAL_TABLET | Freq: Once | ORAL | Status: AC

## 2022-03-26 MED ORDER — ORAL CARE MOUTH RINSE: 15.0000 mL | Freq: Once | OROMUCOSAL | Status: AC

## 2022-03-26 MED ORDER — LACTATED RINGERS IV SOLN: INTRAVENOUS | Status: DC

## 2022-03-26 MED ORDER — CEFAZOLIN SODIUM-DEXTROSE 2-4 GM/100ML-% IV SOLN
2.0000 g | INTRAVENOUS | Status: AC
  Administered 2022-03-27: 2 g via INTRAVENOUS

## 2022-03-26 MED ORDER — CHLORHEXIDINE GLUCONATE 0.12 % MT SOLN: 15.0000 mL | Freq: Once | OROMUCOSAL | Status: AC

## 2022-03-27 ENCOUNTER — Ambulatory Visit
Admission: RE | Admit: 2022-03-27 | Discharge: 2022-03-27 | Disposition: A | Discharge status: Home / Self Care | Payer: BC Managed Care – PPO | Source: Ambulatory Visit | Attending: Surgery | Admitting: Surgery

## 2022-03-27 ENCOUNTER — Ambulatory Visit: Payer: BC Managed Care – PPO | Admitting: Anesthesiology

## 2022-03-27 ENCOUNTER — Encounter
Admission: RE | Disposition: A | Discharge status: Home / Self Care | Payer: Self-pay | Source: Ambulatory Visit | Attending: Surgery

## 2022-03-27 ENCOUNTER — Ambulatory Visit: Payer: BC Managed Care – PPO

## 2022-03-27 ENCOUNTER — Encounter: Payer: Self-pay | Admitting: Surgery

## 2022-03-27 ENCOUNTER — Other Ambulatory Visit: Payer: Self-pay

## 2022-03-27 DIAGNOSIS — E669 Obesity, unspecified: Secondary | ICD-10-CM | POA: Diagnosis not present

## 2022-03-27 DIAGNOSIS — M25811 Other specified joint disorders, right shoulder: Secondary | ICD-10-CM | POA: Diagnosis present

## 2022-03-27 DIAGNOSIS — Z87891 Personal history of nicotine dependence: Secondary | ICD-10-CM | POA: Insufficient documentation

## 2022-03-27 DIAGNOSIS — M7521 Bicipital tendinitis, right shoulder: Secondary | ICD-10-CM | POA: Diagnosis not present

## 2022-03-27 HISTORY — PX: SHOULDER ARTHROSCOPY WITH SUBACROMIAL DECOMPRESSION, ROTATOR CUFF REPAIR AND BICEP TENDON REPAIR: SHX5687

## 2022-03-27 SURGERY — SHOULDER ARTHROSCOPY WITH SUBACROMIAL DECOMPRESSION, ROTATOR CUFF REPAIR AND BICEP TENDON REPAIR
Anesthesia: General | Site: Shoulder | Laterality: Right

## 2022-03-27 MED ORDER — BUPIVACAINE LIPOSOME 1.3 % IJ SUSP
INTRAMUSCULAR | Status: DC | PRN
  Administered 2022-03-27: 10 mL via PERINEURAL

## 2022-03-27 MED ORDER — FAMOTIDINE 20 MG PO TABS
ORAL_TABLET | ORAL | Status: AC
  Administered 2022-03-27: 20 mg via ORAL
  Filled 2022-03-27: qty 1

## 2022-03-27 MED ORDER — FENTANYL CITRATE (PF) 100 MCG/2ML IJ SOLN
INTRAMUSCULAR | Status: AC
  Filled 2022-03-27: qty 2

## 2022-03-27 MED ORDER — OXYCODONE HCL 5 MG PO TABS: 5.0000 mg | ORAL_TABLET | ORAL | 0 refills | Status: AC | PRN

## 2022-03-27 MED ORDER — LACTATED RINGERS IV SOLN
INTRAVENOUS | Status: DC | PRN
  Administered 2022-03-27: 1501 mL

## 2022-03-27 MED ORDER — BUPIVACAINE HCL (PF) 0.5 % IJ SOLN
INTRAMUSCULAR | Status: AC
  Filled 2022-03-27: qty 10

## 2022-03-27 MED ORDER — BUPIVACAINE LIPOSOME 1.3 % IJ SUSP
INTRAMUSCULAR | Status: AC
  Filled 2022-03-27: qty 10

## 2022-03-27 MED ORDER — PROPOFOL 10 MG/ML IV BOLUS
INTRAVENOUS | Status: AC
  Filled 2022-03-27: qty 20

## 2022-03-27 MED ORDER — GLYCOPYRROLATE 0.2 MG/ML IJ SOLN
INTRAMUSCULAR | Status: DC | PRN
  Administered 2022-03-27: .2 mg via INTRAVENOUS

## 2022-03-27 MED ORDER — DROPERIDOL 2.5 MG/ML IJ SOLN: 0.6250 mg | Freq: Once | INTRAMUSCULAR | Status: DC | PRN

## 2022-03-27 MED ORDER — CHLORHEXIDINE GLUCONATE 0.12 % MT SOLN
OROMUCOSAL | Status: AC
  Administered 2022-03-27: 15 mL via OROMUCOSAL
  Filled 2022-03-27: qty 15

## 2022-03-27 MED ORDER — BUPIVACAINE-EPINEPHRINE (PF) 0.5% -1:200000 IJ SOLN
INTRAMUSCULAR | Status: AC
  Filled 2022-03-27: qty 30

## 2022-03-27 MED ORDER — SUGAMMADEX SODIUM 200 MG/2ML IV SOLN
INTRAVENOUS | Status: DC | PRN
  Administered 2022-03-27: 250 mg via INTRAVENOUS

## 2022-03-27 MED ORDER — ACETAMINOPHEN 500 MG PO TABS: 1000.0000 mg | ORAL_TABLET | Freq: Once | ORAL | Status: AC

## 2022-03-27 MED ORDER — LIDOCAINE HCL (CARDIAC) PF 100 MG/5ML IV SOSY
PREFILLED_SYRINGE | INTRAVENOUS | Status: DC | PRN
  Administered 2022-03-27: 100 mg via INTRAVENOUS

## 2022-03-27 MED ORDER — MIDAZOLAM HCL 2 MG/2ML IJ SOLN
INTRAMUSCULAR | Status: AC
  Administered 2022-03-27: 2 mg via INTRAVENOUS
  Filled 2022-03-27: qty 2

## 2022-03-27 MED ORDER — PROPOFOL 10 MG/ML IV BOLUS
INTRAVENOUS | Status: DC | PRN
  Administered 2022-03-27: 200 mg via INTRAVENOUS

## 2022-03-27 MED ORDER — ONDANSETRON HCL 4 MG/2ML IJ SOLN
INTRAMUSCULAR | Status: AC
  Filled 2022-03-27: qty 2

## 2022-03-27 MED ORDER — SUGAMMADEX SODIUM 500 MG/5ML IV SOLN
INTRAVENOUS | Status: AC
  Filled 2022-03-27: qty 5

## 2022-03-27 MED ORDER — FENTANYL CITRATE PF 50 MCG/ML IJ SOSY
PREFILLED_SYRINGE | INTRAMUSCULAR | Status: AC
  Administered 2022-03-27: 50 ug via INTRAVENOUS
  Filled 2022-03-27: qty 1

## 2022-03-27 MED ORDER — CEFAZOLIN SODIUM-DEXTROSE 2-4 GM/100ML-% IV SOLN
INTRAVENOUS | Status: AC
  Filled 2022-03-27: qty 100

## 2022-03-27 MED ORDER — FENTANYL CITRATE (PF) 100 MCG/2ML IJ SOLN
INTRAMUSCULAR | Status: DC | PRN
  Administered 2022-03-27 (×2): 50 ug via INTRAVENOUS

## 2022-03-27 MED ORDER — ROCURONIUM BROMIDE 100 MG/10ML IV SOLN
INTRAVENOUS | Status: DC | PRN
  Administered 2022-03-27: 60 mg via INTRAVENOUS

## 2022-03-27 MED ORDER — LIDOCAINE HCL (PF) 1 % IJ SOLN
INTRAMUSCULAR | Status: DC | PRN
  Administered 2022-03-27: 1 mL via SUBCUTANEOUS

## 2022-03-27 MED ORDER — ACETAMINOPHEN 500 MG PO TABS
ORAL_TABLET | ORAL | Status: AC
  Administered 2022-03-27: 1000 mg via ORAL
  Filled 2022-03-27: qty 2

## 2022-03-27 MED ORDER — ACETAMINOPHEN 10 MG/ML IV SOLN: 1000.0000 mg | Freq: Once | INTRAVENOUS | Status: DC | PRN

## 2022-03-27 MED ORDER — EPINEPHRINE PF 1 MG/ML IJ SOLN
INTRAMUSCULAR | Status: AC
  Filled 2022-03-27: qty 1

## 2022-03-27 MED ORDER — ONDANSETRON HCL 4 MG/2ML IJ SOLN
INTRAMUSCULAR | Status: DC | PRN
  Administered 2022-03-27: 4 mg via INTRAVENOUS

## 2022-03-27 MED ORDER — FENTANYL CITRATE (PF) 100 MCG/2ML IJ SOLN: 25.0000 ug | INTRAMUSCULAR | Status: DC | PRN

## 2022-03-27 MED ORDER — KETOROLAC TROMETHAMINE 30 MG/ML IJ SOLN
INTRAMUSCULAR | Status: DC | PRN
  Administered 2022-03-27: 30 mg via INTRAVENOUS

## 2022-03-27 MED ORDER — MIDAZOLAM HCL 2 MG/2ML IJ SOLN: 2.0000 mg | Freq: Once | INTRAMUSCULAR | Status: AC

## 2022-03-27 MED ORDER — KETOROLAC TROMETHAMINE 30 MG/ML IJ SOLN
INTRAMUSCULAR | Status: AC
  Filled 2022-03-27: qty 1

## 2022-03-27 MED ORDER — PROMETHAZINE HCL 25 MG/ML IJ SOLN: 6.2500 mg | INTRAMUSCULAR | Status: DC | PRN

## 2022-03-27 MED ORDER — OXYCODONE HCL 5 MG/5ML PO SOLN: 5.0000 mg | Freq: Once | ORAL | Status: DC | PRN

## 2022-03-27 MED ORDER — LIDOCAINE HCL (PF) 2 % IJ SOLN
INTRAMUSCULAR | Status: AC
  Filled 2022-03-27: qty 5

## 2022-03-27 MED ORDER — BUPIVACAINE-EPINEPHRINE 0.5% -1:200000 IJ SOLN
INTRAMUSCULAR | Status: DC | PRN
  Administered 2022-03-27: 30 mL

## 2022-03-27 MED ORDER — DEXAMETHASONE SODIUM PHOSPHATE 10 MG/ML IJ SOLN
INTRAMUSCULAR | Status: DC | PRN
  Administered 2022-03-27: 10 mg via INTRAVENOUS

## 2022-03-27 MED ORDER — FENTANYL CITRATE PF 50 MCG/ML IJ SOSY: 50.0000 ug | PREFILLED_SYRINGE | Freq: Once | INTRAMUSCULAR | Status: AC

## 2022-03-27 MED ORDER — LIDOCAINE HCL (PF) 1 % IJ SOLN
INTRAMUSCULAR | Status: AC
  Filled 2022-03-27: qty 5

## 2022-03-27 MED ORDER — BUPIVACAINE HCL (PF) 0.5 % IJ SOLN
INTRAMUSCULAR | Status: DC | PRN
  Administered 2022-03-27: 10 mL via PERINEURAL

## 2022-03-27 MED ORDER — ROCURONIUM BROMIDE 10 MG/ML (PF) SYRINGE
PREFILLED_SYRINGE | INTRAVENOUS | Status: AC
  Filled 2022-03-27: qty 10

## 2022-03-27 MED ORDER — OXYCODONE HCL 5 MG PO TABS: 5.0000 mg | ORAL_TABLET | Freq: Once | ORAL | Status: DC | PRN

## 2022-03-27 MED ORDER — GLYCOPYRROLATE 0.2 MG/ML IJ SOLN
INTRAMUSCULAR | Status: AC
  Filled 2022-03-27: qty 1

## 2022-03-27 MED ORDER — DEXAMETHASONE SODIUM PHOSPHATE 10 MG/ML IJ SOLN
INTRAMUSCULAR | Status: AC
  Filled 2022-03-27: qty 1

## 2022-03-27 SURGICAL SUPPLY — 56 items
ANCH SUT 2 JK 1.5X2.9 2 LD (Anchor) ×1 IMPLANT
ANCHOR HEALICOIL REGEN 5.5 (Anchor) IMPLANT
ANCHOR JUGGERKNOT WTAP NDL 2.9 (Anchor) IMPLANT
ANCHOR SUT JK SZ 2 2.9 DBL SL (Anchor) IMPLANT
ANCHOR SUT QUATTRO KNTLS 4.5 (Anchor) IMPLANT
ANCHOR SUT W/ ORTHOCORD (Anchor) IMPLANT
APL PRP STRL LF DISP 70% ISPRP (MISCELLANEOUS) ×1
BIT DRILL JUGRKNT W/NDL BIT2.9 (DRILL) IMPLANT
BLADE FULL RADIUS 3.5 (BLADE) ×1 IMPLANT
BUR ACROMIONIZER 4.0 (BURR) ×1 IMPLANT
CANNULA SHAVER 8MMX76MM (CANNULA) ×1 IMPLANT
CHLORAPREP W/TINT 26 (MISCELLANEOUS) ×1 IMPLANT
COVER MAYO STAND REUSABLE (DRAPES) ×1 IMPLANT
DILATOR 5.5 THREADED HEALICOIL (MISCELLANEOUS) IMPLANT
DRILL JUGGERKNOT W/NDL BIT 2.9 (DRILL) ×1
ELECT CAUTERY BLADE 6.4 (BLADE) ×1 IMPLANT
ELECT REM PT RETURN 9FT ADLT (ELECTROSURGICAL) ×1
ELECTRODE REM PT RTRN 9FT ADLT (ELECTROSURGICAL) ×1 IMPLANT
GAUZE SPONGE 4X4 12PLY STRL (GAUZE/BANDAGES/DRESSINGS) ×1 IMPLANT
GAUZE XEROFORM 1X8 LF (GAUZE/BANDAGES/DRESSINGS) ×1 IMPLANT
GLOVE BIO SURGEON STRL SZ7.5 (GLOVE) ×2 IMPLANT
GLOVE BIO SURGEON STRL SZ8 (GLOVE) ×2 IMPLANT
GLOVE BIOGEL PI IND STRL 8 (GLOVE) ×1 IMPLANT
GLOVE SURG UNDER LTX SZ8 (GLOVE) ×1 IMPLANT
GOWN STRL REUS W/ TWL LRG LVL3 (GOWN DISPOSABLE) ×1 IMPLANT
GOWN STRL REUS W/ TWL XL LVL3 (GOWN DISPOSABLE) ×1 IMPLANT
GOWN STRL REUS W/TWL LRG LVL3 (GOWN DISPOSABLE) ×1
GOWN STRL REUS W/TWL XL LVL3 (GOWN DISPOSABLE) ×1
GRASPER SUT 15 45D LOW PRO (SUTURE) IMPLANT
HOLSTER ELECTROSUGICAL PENCIL (MISCELLANEOUS) IMPLANT
IV LACTATED RINGER IRRG 3000ML (IV SOLUTION) ×1
IV LR IRRIG 3000ML ARTHROMATIC (IV SOLUTION) ×2 IMPLANT
KIT CANNULA 8X76-LX IN CANNULA (CANNULA) IMPLANT
MANIFOLD NEPTUNE II (INSTRUMENTS) ×2 IMPLANT
MASK FACE SPIDER DISP (MASK) ×1 IMPLANT
MAT ABSORB  FLUID 56X50 GRAY (MISCELLANEOUS) ×1
MAT ABSORB FLUID 56X50 GRAY (MISCELLANEOUS) ×1 IMPLANT
PACK ARTHROSCOPY SHOULDER (MISCELLANEOUS) ×1 IMPLANT
PAD ABD DERMACEA PRESS 5X9 (GAUZE/BANDAGES/DRESSINGS) ×2 IMPLANT
PASSER SUT FIRSTPASS SELF (INSTRUMENTS) IMPLANT
SLEEVE REMOTE CONTROL 5X12 (DRAPES) IMPLANT
SLING ARM LRG DEEP (SOFTGOODS) ×1 IMPLANT
SLING ULTRA II LG (MISCELLANEOUS) ×1 IMPLANT
SPONGE T-LAP 18X18 ~~LOC~~+RFID (SPONGE) ×1 IMPLANT
STAPLER SKIN PROX 35W (STAPLE) ×1 IMPLANT
STRAP SAFETY 5IN WIDE (MISCELLANEOUS) ×1 IMPLANT
SUT ETHIBOND 0 MO6 C/R (SUTURE) ×1 IMPLANT
SUT ULTRABRAID 2 COBRAID 38 (SUTURE) IMPLANT
SUT VIC AB 2-0 CT1 27 (SUTURE) ×1
SUT VIC AB 2-0 CT1 TAPERPNT 27 (SUTURE) ×2 IMPLANT
TAPE MICROFOAM 4IN (TAPE) ×1 IMPLANT
TRAP FLUID SMOKE EVACUATOR (MISCELLANEOUS) ×1 IMPLANT
TUBING CONNECTING 10 (TUBING) ×1 IMPLANT
TUBING INFLOW SET DBFLO PUMP (TUBING) ×1 IMPLANT
WAND WEREWOLF FLOW 90D (MISCELLANEOUS) ×1 IMPLANT
WATER STERILE IRR 500ML POUR (IV SOLUTION) ×1 IMPLANT

## 2022-03-27 NOTE — Anesthesia Postprocedure Evaluation (Signed)
Anesthesia Post Note  Patient: Perry Martin  Procedure(s) Performed: RIGHT SHOULDER ARTHROSCOPY WITH DEBRIDEMENT, DECOMPRESSION, AND BICEPS TENODESIS. - RNFA (Right: Shoulder)  Patient location during evaluation: PACU Anesthesia Type: General Level of consciousness: awake and alert Pain management: pain level controlled Vital Signs Assessment: post-procedure vital signs reviewed and stable Respiratory status: spontaneous breathing, nonlabored ventilation and respiratory function stable Cardiovascular status: blood pressure returned to baseline and stable Postop Assessment: no apparent nausea or vomiting Anesthetic complications: no   No notable events documented.   Last Vitals:  Vitals:   03/27/22 1413 03/27/22 1427  BP:  124/86  Pulse: 79 63  Resp: (!) 8 18  Temp: (!) 36.1 C (!) 36.4 C  SpO2: 95% 96%    Last Pain:  Vitals:   03/27/22 1427  TempSrc: Temporal  PainSc: 0-No pain                 Foye Deer

## 2022-03-27 NOTE — Anesthesia Procedure Notes (Signed)
Procedure Name: Intubation Date/Time: 03/27/2022 11:48 AM  Performed by: Morene Crocker, CRNAPre-anesthesia Checklist: Patient identified, Patient being monitored, Timeout performed, Emergency Drugs available and Suction available Patient Re-evaluated:Patient Re-evaluated prior to induction Oxygen Delivery Method: Circle system utilized Preoxygenation: Pre-oxygenation with 100% oxygen Induction Type: IV induction Ventilation: Mask ventilation without difficulty, Two handed mask ventilation required and Oral airway inserted - appropriate to patient size Laryngoscope Size: 3 and McGraph Grade View: Grade I Tube type: Oral Tube size: 7.5 mm Number of attempts: 1 Airway Equipment and Method: Stylet Placement Confirmation: ETT inserted through vocal cords under direct vision, positive ETCO2 and breath sounds checked- equal and bilateral Secured at: 22 cm Tube secured with: Tape Dental Injury: Teeth and Oropharynx as per pre-operative assessment

## 2022-03-27 NOTE — Op Note (Signed)
03/27/2022  1:12 PM  Patient:   Perry Martin  Pre-Op Diagnosis:   Impingement/tendinopathy, right shoulder.  Post-Op Diagnosis:   Impingement/tendinopathy with biceps tendinopathy, right shoulder.  Procedure:   Limited arthroscopic debridement, arthroscopic subacromial decompression, and mini-open biceps tenodesis, right shoulder.  Anesthesia:   General endotracheal with interscalene block using Exparel placed preoperatively by the anesthesiologist.  Surgeon:   Maryagnes Amos, MD  Assistant:   Arsenio Loader, RNFA-S  Findings:   As above. The rotator cuff was in excellent condition. There was minimal fraying of the labrum and no significant synovitis. The biceps tendon demonstrated evidence of "lip sticking", but no evidence of partial or full-thickness tearing. The articular surfaces of the glenoid and humerus both were in excellent condition.  Complications:   None  Fluids:   700 cc  Estimated blood loss:   5 cc  Tourniquet time:   None  Drains:   None  Closure:   Staples      Brief clinical note:   The patient is a 36 year old male with history of gradually worsening right shoulder pain. The patient's symptoms have progressed despite medications, activity modification, etc. The patient's history and examination are consistent with impingement/tendinopathy. A preoperative MRI scan was inconclusive for any rotator cuff or labral pathology. The patient presents at this time for definitive management of these shoulder symptoms.  Procedure:   The patient underwent placement of an interscalene block using Exparel by the anesthesiologist in the preoperative holding area before being brought into the operating room and lain in the supine position. The patient then underwent general endotracheal intubation and anesthesia before being repositioned in the beach chair position using the beach chair positioner. The right shoulder and upper extremity were prepped with ChloraPrep  solution before being draped sterilely. Preoperative antibiotics were administered. A timeout was performed to confirm the proper surgical site before the expected portal sites and incision site were injected with 0.5% Sensorcaine with epinephrine.   A posterior portal was created and the glenohumeral joint thoroughly inspected with the findings as described above. An anterior portal was created using an outside-in technique. The labrum and rotator cuff were further probed, again confirming the above-noted findings. Areas of minimal labral fraying were debrided back to stable margins using a full-radius resector. The ArthroCare wand was inserted and used to release the biceps tendon from its labral anchor.  It also was used to obtain hemostasis as well as to "anneal" the labrum superiorly and anteriorly. The instruments were removed from the joint after suctioning the excess fluid.  The camera was repositioned through the posterior portal into the subacromial space. A separate lateral portal was created using an outside-in technique. The 3.5 mm full-radius resector was introduced and used to perform a subtotal bursectomy. The ArthroCare wand was then inserted and used to remove the periosteal tissue off the undersurface of the anterior third of the acromion as well as to recess the coracoacromial ligament from its attachment along the anterior and lateral margins of the acromion. The 4.0 mm acromionizing bur was introduced and used to complete the decompression by removing the undersurface of the anterior third of the acromion. The full radius resector was reintroduced to remove any residual bony debris before the ArthroCare wand was reintroduced to obtain hemostasis. The instruments were then removed from the subacromial space after suctioning the excess fluid.  An approximately 4-5 cm incision was made over the anterolateral aspect of the shoulder beginning at the anterolateral corner of the acromion  and  extending distally in line with the bicipital groove. This incision was carried down through the subcutaneous tissues to expose the deltoid fascia. The raphae between the anterior and middle thirds was identified and this plane developed to provide access into the subacromial space. Additional bursal tissues were debrided sharply using Metzenbaum scissors. The rotator cuff readily identified.  After careful inspection both visually and by palpation, there was no evidence of any bursal sided partial or full-thickness tears. However, the bursal tissues were noted to be quite chronically thickened.  The bicipital groove was identified by palpation and opened for 1-1.5 cm. The biceps tendon stump was retrieved through this defect. The floor of the bicipital groove was roughened with a curet before a Biomet 2.9 mm JuggerKnot anchor was inserted. Both sets of sutures were passed through the biceps tendon and tied securely to effect the tenodesis. The bicipital sheath was reapproximated using two #0 Ethibond interrupted sutures, incorporating the biceps tendon to further reinforce the tenodesis.  The wound was copiously irrigated with sterile saline solution before the deltoid raphae was reapproximated using 2-0 Vicryl interrupted sutures. The subcutaneous tissues were closed in two layers using 2-0 Vicryl interrupted sutures before the skin was closed using staples. The portal sites also were closed using staples. A sterile bulky dressing was applied to the shoulder before the arm was placed into a shoulder immobilizer. The patient was then awakened, extubated, and returned to the recovery room in satisfactory condition after tolerating the procedure well.

## 2022-03-27 NOTE — Transfer of Care (Signed)
Immediate Anesthesia Transfer of Care Note  Patient: Perry Martin  Procedure(s) Performed: RIGHT SHOULDER ARTHROSCOPY WITH DEBRIDEMENT, DECOMPRESSION, AND BICEPS TENODESIS. - RNFA (Right: Shoulder)  Patient Location: PACU  Anesthesia Type:General  Level of Consciousness: awake  Airway & Oxygen Therapy: Patient Spontanous Breathing and Patient connected to face mask oxygen  Post-op Assessment: Report given to RN and Post -op Vital signs reviewed and stable  Post vital signs: Reviewed and stable  Last Vitals:  Vitals Value Taken Time  BP 156/82 03/27/22 1321  Temp 36.1 C 03/27/22 1321  Pulse 75 03/27/22 1327  Resp 18 03/27/22 1327  SpO2 98 % 03/27/22 1327  Vitals shown include unvalidated device data.  Last Pain:  Vitals:   03/27/22 1321  TempSrc:   PainSc: Asleep         Complications: No notable events documented.

## 2022-03-27 NOTE — Anesthesia Preprocedure Evaluation (Addendum)
Anesthesia Evaluation  Patient identified by MRN, date of birth, ID band Patient awake    Reviewed: Allergy & Precautions, NPO status , Patient's Chart, lab work & pertinent test results  Airway Mallampati: II  TM Distance: >3 FB Neck ROM: full    Dental no notable dental hx.    Pulmonary former smoker   Pulmonary exam normal        Cardiovascular negative cardio ROS Normal cardiovascular exam     Neuro/Psych negative neurological ROS  negative psych ROS   GI/Hepatic negative GI ROS, Neg liver ROS,,,  Endo/Other  negative endocrine ROS    Renal/GU      Musculoskeletal  (+) Arthritis  (Impingement syndrome of right shoulder),    Abdominal  (+) + obese  Peds  Hematology negative hematology ROS (+)   Anesthesia Other Findings Past Medical History: No date: Arthritis     Comment:  (R) shoulder, (L) knee No date: Complication of anesthesia     Comment:  has headache after procedure No date: Diverticula of colon     Comment:  History of No date: Family history of adverse reaction to anesthesia     Comment:  mother has nausea and vomiting and requires scope patch  Past Surgical History: 06/02/2013: COLON RESECTION SIGMOID     Comment:  second surgery for hematoma No date: COLONOSCOPY 03/11/2018: REPAIR EXTENSOR TENDON; Right     Comment:  Procedure: DEBRIDEMENT OF THE COMMON EXTENSOR ORGIN OF               RIGHT ELBOW;  Surgeon: Christena Flake, MD;  Location:               [facility];  Service: Orthopedics;  Laterality:               Right; 03/15/2020: TENNIS ELBOW RELEASE/NIRSCHEL PROCEDURE; Left     Comment:  Procedure: DEBRIDEMENT OF THE COMMON EXTENSOR ORIGIN OF               LEFT ELBOW;  Surgeon: Christena Flake, MD;  Location: [facility]               [facility];  Service: Orthopedics;  Laterality: Left; No date: torn ligament; Left     Comment:  wrist     Reproductive/Obstetrics negative OB ROS                              Anesthesia Physical Anesthesia Plan  ASA: 2  Anesthesia Plan: General ETT   Post-op Pain Management: Regional block* and Tylenol PO (pre-op)*   Induction: Intravenous  PONV Risk Score and Plan: 2 and Ondansetron, Dexamethasone and Midazolam  Airway Management Planned: Oral ETT  Additional Equipment:   Intra-op Plan:   Post-operative Plan: Extubation in OR  Informed Consent: I have reviewed the patients History and Physical, chart, labs and discussed the procedure including the risks, benefits and alternatives for the proposed anesthesia with the patient or authorized representative who has indicated his/her understanding and acceptance.     Dental Advisory Given  Plan Discussed with: Anesthesiologist, CRNA and Surgeon  Anesthesia Plan Comments:        Anesthesia Quick Evaluation

## 2022-03-27 NOTE — Discharge Instructions (Signed)
Orthopedic discharge instructions: Keep dressing dry and intact.  May shower after dressing changed on post-op day #4 (Sunday).  Cover staples with Band-Aids after drying off. Apply ice frequently to shoulder. Take ibuprofen 600-800 mg TID with meals for 3-5 days, then as necessary. Take oxycodone as prescribed when needed.  May supplement with ES Tylenol if necessary. Keep shoulder immobilizer on at all times except may remove for bathing purposes. Follow-up in 10-14 days or as scheduled.

## 2022-03-27 NOTE — H&P (Signed)
History of Present Illness:  Perry Martin is a 36 y.o. male who presents for follow-up of his right shoulder pain secondary to impingement/tendinopathy. Overall, the patient notes little change in symptoms since his last visit 6 weeks ago. He continues to experience moderate pain in his shoulder which he rates at 5/10 on today's visit, and for which he has been taking ibuprofen as necessary with temporary partial relief. The pain is localized to the anterolateral aspect of the shoulder and occasionally will radiate down to his mid forearm region. He has completed formal physical therapy since his last visit, but continues to try to do exercises on his own at home. His symptoms are aggravated by repetitive activities, by activities at or above shoulder level, as well as at night. He denies any reinjury to the right shoulder since his last visit, and denies any numbness or paresthesias down his arm to his hand. He denies any neck pain. He is right-hand dominant.  Current Outpatient Medications: metroNIDAZOLE (METROGEL) 1 % gel Apply topically once daily 45 g 0   Allergies: No Known Allergies  Past Medical History:  Allergic rhinitis, seasonal 02/14/2015  Mild seasonal, takes over the counter meds  H/O diverticulitis of colon 02/14/2015  Impingement syndrome of right shoulder 02/14/2015  Mild obesity, unspecified 02/14/2015   Past Surgical History:  Robotic sigmoid resection complicated by intra-abdominal hematoma evacuated postop day 2 02/2014  Debr./repair of common extensor origin right elbow 03/11/2018 (Dr. Joice Lofts)  Debr./repair of common extensor origin, left elbow 03/15/2020 (Dr. Joice Lofts)   Family History:  Diabetes type II Father  Skin cancer Father  Depression Father  No Known Problems Brother  Diabetes type II Maternal Grandmother  Hyperlipidemia (Elevated cholesterol) Maternal Grandmother  High blood pressure (Hypertension) Maternal Grandmother  Lung cancer Maternal  Grandfather  Emphysema Maternal Grandfather  Breast cancer Paternal Grandmother  Throat cancer Paternal Grandfather  No Known Problems Daughter  No Known Problems Son  No Known Problems Brother   Social History:   Socioeconomic History:  Marital status: Married  Years of education: 16  Occupational History  Occupation: [employer] fast [employer]er  Occupation: junk removal  Tobacco Use  Smoking status: Former  Packs/day: 0.25  Years: 2.00  Additional pack years: 0.00  Total pack years: 0.50  Types: Cigarettes  Quit date: 04/09/2013  Years since quitting: 8.8  Smokeless tobacco: Never  Vaping Use  Vaping Use: Never used  Substance and Sexual Activity  Alcohol use: Yes  Comment: occasionally  Drug use: No  Sexual activity: Yes  Partners: Female  Birth control/protection: Pill  Social History Narrative  Lives with wife, expecting first child 4/17. 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 Has guns but keeps locked up.   Review of Systems:  A comprehensive 14 point ROS was performed, reviewed, and the pertinent orthopaedic findings are documented in the HPI.  Physical Exam: Vitals:  03/01/22 0829  BP: 132/88  Weight: (!) 120.9 kg (266 lb 9.6 oz)  Height: 182.9 cm (6')  PainSc: 5  PainLoc: Shoulder   General/Constitutional: The patient appears to be well-nourished, well-developed, and in no acute distress. Neuro/Psych: Normal mood and affect, oriented to person, place and time. Eyes: Non-icteric. Pupils are equal, round, and reactive to light, and exhibit synchronous movement. ENT: Unremarkable. Lymphatic: No palpable adenopathy. Respiratory: Lungs clear to auscultation, Normal chest excursion, No wheezes, and Non-labored breathing Cardiovascular: Regular rate and rhythm. No murmurs. and No edema,  swelling or tenderness, except as noted in detailed exam. Integumentary: No impressive skin lesions present, except  as noted in detailed exam. Musculoskeletal: Unremarkable, except as noted in detailed exam.  Right shoulder exam: SKIN: Normal SWELLING: None WARMTH: None LYMPH NODES: No adenopathy palpable CREPITUS: None TENDERNESS: Mild tenderness to palpation over the anterior shoulder in the area of the bicipital groove, but no tenderness over the [facility] joint nor over the site of the os acromiale. ROM (active):  Forward flexion: 170 degrees Abduction: 165 degrees Internal rotation: L1 ROM (passive):  Forward flexion: 175 degrees Abduction: 170 degrees ER/IR at 90 abd: 100 degrees/60 degrees  He experiences mild pain with abduction through an arc of 90 to 120 degrees, as well as with internal rotation at 90 degrees of abduction.  STRENGTH: Forward flexion: 4+/5 Abduction: 4+/5 External rotation: 4+/5 Internal rotation: 4+-5/5 Pain with RC testing: Minimal pain with resisted abduction  STABILITY: Normal  SPECIAL TESTS: Juanetta Gosling' test: Mildly positive Speed's test: Negative Capsulitis - pain w/ passive ER: No Crossed arm test: Minimally positive Crank: Negative Anterior apprehension: Negative Posterior apprehension: Negative  Cervical Spine: Supple, non-tender.  ROM: Flexion: 40 degrees Extension: 35 degrees Left/Right turn: 70 degrees / 70 degrees Left/Right tilt: 30 degrees / 30 degrees  He has no pain with range of motion. Neck motion does not reproduce the patient's shoulder symptoms. He has a negative Spurling's test bilaterally. He remains neurovascularly intact to the right upper extremity.  X-rays/MRI/Lab data:  A recent arthro-MRI scan of the right shoulder is available for review and has been reviewed by myself. By report, the study demonstrates evidence of mild tendinopathy of the supraspinatus and infraspinatus tendons without any evidence for partial or full-thickness tearing. There is no evidence for any labral pathology nor is there any evidence for any biceps  tendinopathy or tearing. No significant degenerative changes are noted, nor is there any evidence of bony abnormalities. Both the films and report were reviewed by myself and discussed with the patient.  Assessment: 1. Rotator cuff tendinitis, right.  2. Impingement syndrome of right shoulder.   Plan: The treatment options were discussed with the patient. In addition, patient educational materials were provided regarding the diagnosis and treatment options. The patient is quite frustrated by his symptoms and function limitations. Based on his history and examination findings, I feel that the majority of symptoms are indeed coming from his shoulder rather than from his neck. However, even though he does have a history of is acromion late. He is not tender directly over the defect, nor does the MRI scan show any evidence of increased bone marrow edema in this area. Therefore, I do not feel that this is the source of his symptoms at this time. Given the persistence of his symptoms despite extensive nonoperative treatment, I have recommended a surgical procedure, specifically a right shoulder arthroscopy with debridement, decompression, and possible biceps tenodesis. The procedure was discussed with the patient, as were the potential risks (including bleeding, infection, nerve and/or blood vessel injury, persistent or recurrent pain, failure of the repair, progression of arthritis, need for further surgery, blood clots, strokes, heart attacks and/or arhythmias, pneumonia, etc.) and benefits. The patient states his understanding and wishes to proceed. All of the patient's questions and concerns were answered. He can call any time with further concerns. He will follow up post-surgery, routine.    H&P reviewed and patient re-examined. No changes.

## 2022-03-27 NOTE — Anesthesia Procedure Notes (Signed)
Anesthesia Regional Block: Interscalene brachial plexus block   Pre-Anesthetic Checklist: , timeout performed,  Correct Patient, Correct Site, Correct Laterality,  Correct Procedure, Correct Position, site marked,  Risks and benefits discussed,  Surgical consent,  Pre-op evaluation,  At surgeon's request and post-op pain management  Laterality: Upper and Right  Prep: chloraprep       Needles:  Injection technique: Single-shot  Needle Type: Stimiplex     Needle Length: 9cm  Needle Gauge: 22     Additional Needles:   Procedures:,,,, ultrasound used (permanent image in chart),,    Narrative:  Start time: 03/27/2022 10:44 AM End time: 03/27/2022 10:47 AM Injection made incrementally with aspirations every 5 mL.  Performed by: Personally  Anesthesiologist: Foye Deer, MD  Additional Notes: Patient consented for risk and benefits of nerve block including but not limited to nerve damage, failed block, bleeding and infection.  Patient voiced understanding.  Functioning IV was confirmed and monitors were applied.  Timeout done prior to procedure and prior to any sedation being given to the patient.  Patient confirmed procedure site prior to any sedation given to the patient. Sterile prep,hand hygiene and sterile gloves were used.  Minimal sedation used for procedure.  No paresthesia endorsed by patient during the procedure.  Negative aspiration and negative test dose prior to incremental administration of local anesthetic. The patient tolerated the procedure well with no immediate complications.

## 2022-04-21 ENCOUNTER — Telehealth: Payer: Self-pay | Admitting: Urgent Care

## 2022-04-21 DIAGNOSIS — J0100 Acute maxillary sinusitis, unspecified: Secondary | ICD-10-CM

## 2022-04-21 MED ORDER — AMOXICILLIN-POT CLAVULANATE 875-125 MG PO TABS: 1.0000 | ORAL_TABLET | Freq: Two times a day (BID) | ORAL | 0 refills | Status: AC

## 2022-04-21 NOTE — Progress Notes (Signed)

## 2022-06-28 ENCOUNTER — Telehealth: Payer: BC Managed Care – PPO | Admitting: Nurse Practitioner

## 2022-06-28 DIAGNOSIS — J0140 Acute pansinusitis, unspecified: Secondary | ICD-10-CM

## 2022-06-28 MED ORDER — IPRATROPIUM BROMIDE 0.03 % NA SOLN: 2.0000 | Freq: Two times a day (BID) | NASAL | 12 refills | Status: AC

## 2022-06-28 MED ORDER — AMOXICILLIN-POT CLAVULANATE 875-125 MG PO TABS: 1.0000 | ORAL_TABLET | Freq: Two times a day (BID) | ORAL | 0 refills | Status: AC

## 2022-06-28 NOTE — Progress Notes (Signed)
E-Visit for Sinus Problems  We are sorry that you are not feeling well.  Here is how we plan to help!  Based on what you have shared with me it looks like you have sinusitis.  Sinusitis is inflammation and infection in the sinus cavities of the head.  Based on your presentation I believe you most likely have Acute Bacterial Sinusitis.  This is an infection caused by bacteria and is treated with antibiotics. I have prescribed Augmentin '875mg'$ /'125mg'$  one tablet twice daily with food, for 7 days. We will also prescribe a nasal spray to help with your symptoms and help prevent future sinus infections with daily use.   Meds ordered this encounter  Medications   amoxicillin-clavulanate (AUGMENTIN) 875-125 MG tablet    Sig: Take 1 tablet by mouth 2 (two) times daily for 7 days. Take with food    Dispense:  14 tablet    Refill:  0   ipratropium (ATROVENT) 0.03 % nasal spray    Sig: Place 2 sprays into both nostrils every 12 (twelve) hours.    Dispense:  30 mL    Refill:  12     You may use an oral decongestant such as Mucinex D or if you have glaucoma or high blood pressure use plain Mucinex. Saline nasal spray help and can safely be used as often as needed for congestion.  If you develop worsening sinus pain, fever or notice severe headache and vision changes, or if symptoms are not better after completion of antibiotic, please schedule an appointment with a health care provider.    Sinus infections are not as easily transmitted as other respiratory infection, however we still recommend that you avoid close contact with loved ones, especially the very young and elderly.  Remember to wash your hands thoroughly throughout the day as this is the number one way to prevent the spread of infection!  Home Care: Only take medications as instructed by your medical team. Complete the entire course of an antibiotic. Do not take these medications with alcohol. A steam or ultrasonic humidifier can help  congestion.  You can place a towel over your head and breathe in the steam from hot water coming from a faucet. Avoid close contacts especially the very young and the elderly. Cover your mouth when you cough or sneeze. Always remember to wash your hands.  Get Help Right Away If: You develop worsening fever or sinus pain. You develop a severe head ache or visual changes. Your symptoms persist after you have completed your treatment plan.  Make sure you Understand these instructions. Will watch your condition. Will get help right away if you are not doing well or get worse.  Thank you for choosing an e-visit.  Your e-visit answers were reviewed by a board certified advanced clinical practitioner to complete your personal care plan. Depending upon the condition, your plan could have included both over the counter or prescription medications.  Please review your pharmacy choice. Make sure the pharmacy is open so you can pick up prescription now. If there is a problem, you may contact your provider through [employer] and have the prescription routed to another pharmacy.  Your safety is important to [place]. If you have drug allergies check your prescription carefully.   For the next 24 hours you can use MyChart to ask questions about today's visit, request a non-urgent call back, or ask for a work or school excuse. You will get an email in the next two days asking  about your experience. I hope that your e-visit has been valuable and will speed your recovery.   I spent approximately 5 minutes reviewing the patient's history, current symptoms and coordinating their care today.

## 2022-07-31 IMAGING — MR MR ELBOW*L* W/O CM
4 series · 39 of 40 positions shown · non-contrast
Comparison: None.

CLINICAL DATA: Chronic left elbow pain. No injury or prior surgery.

EXAM:
MRI OF THE LEFT ELBOW WITHOUT CONTRAST
TECHNIQUE: Multiplanar, multisequence MR imaging of the elbow was performed. No
intravenous contrast was administered.

[Series 5: T2 fat-sat · axial · 4.0mm · 0.55mm/px · z∈[-126,+23]mm · 12 of 35 slices shown (1 of 2)]
[im 1/35]
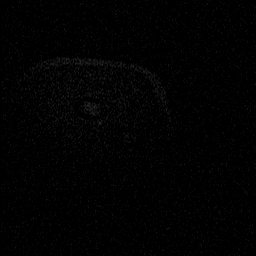
[im 4/35]
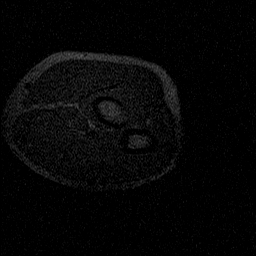
[im 7/35]
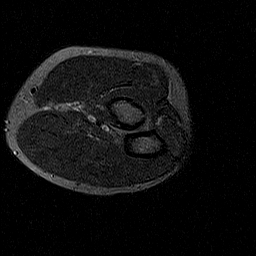
[im 10/35]
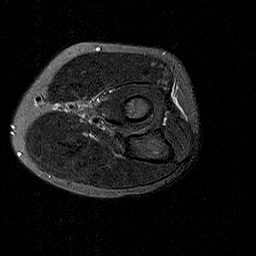
[im 13/35]
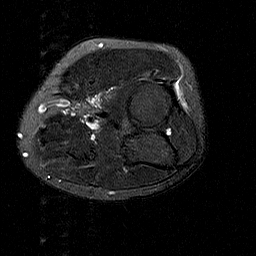
[im 16/35]
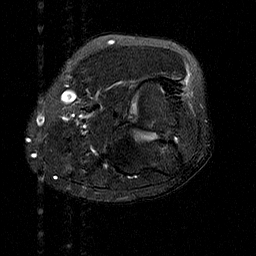
[im 19/35]
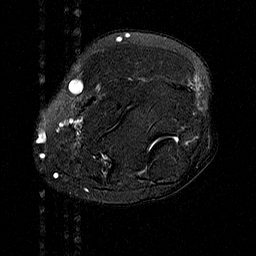
[im 22/35]
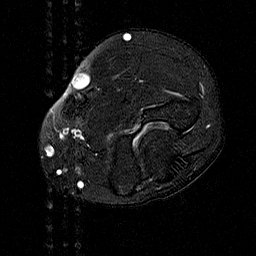
[im 25/35]
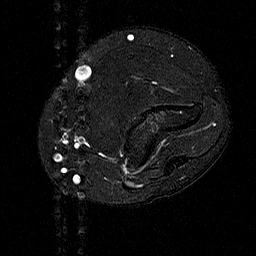
[im 28/35]
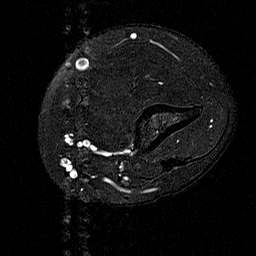
[im 31/35]
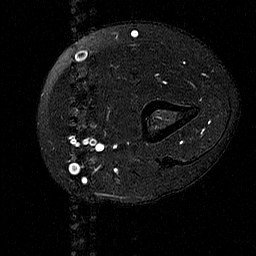
[im 35/35]
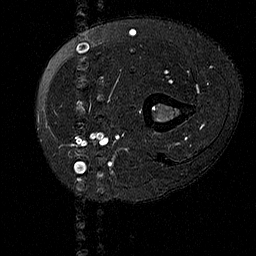

[Series 7: T1 · coronal · 4.0mm · 0.50mm/px · 9 of 24 slices shown]
[im 1/24]
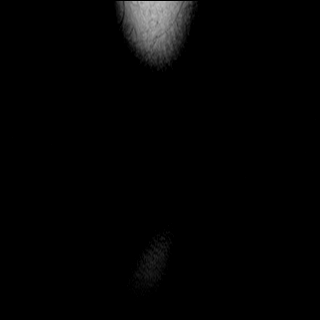
[im 3/24]
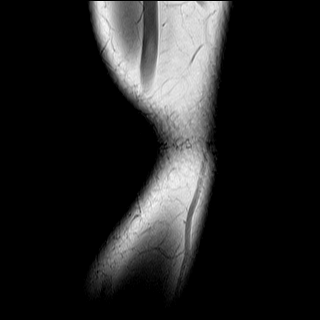
[im 6/24]
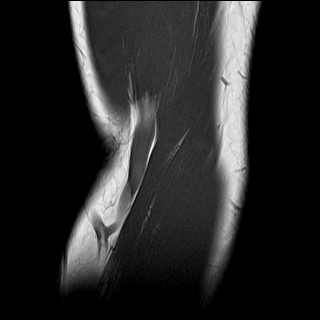
[im 9/24]
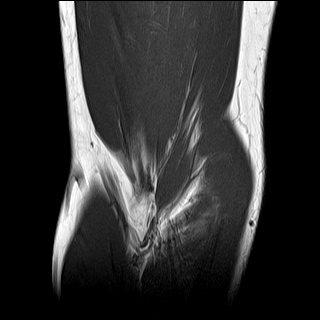
[im 12/24]
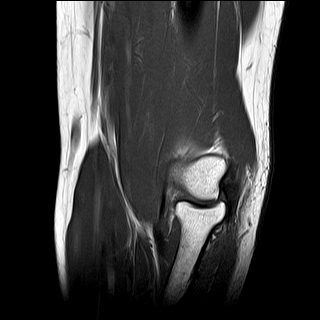
[im 15/24]
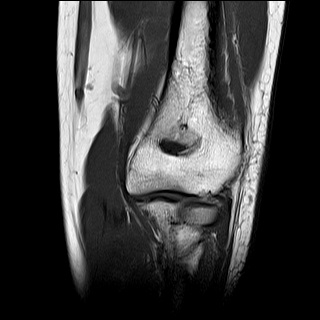
[im 18/24]
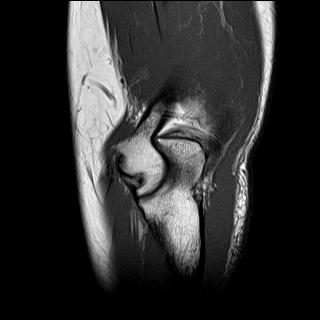
[im 21/24]
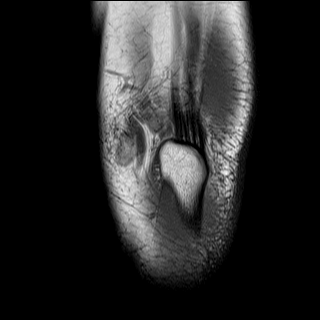
[im 24/24]
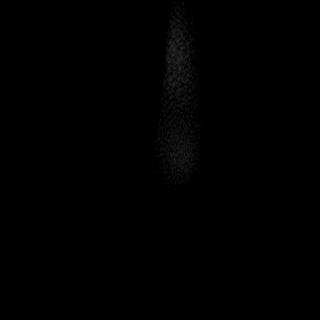

[Series 8: T2 fat-sat · coronal · 3.0mm · 0.55mm/px · 9 of 25 slices shown (2 of 2)]
[im 1/25]
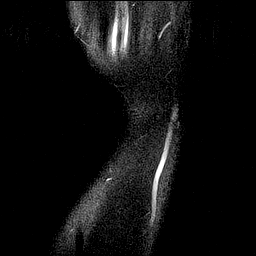
[im 4/25]
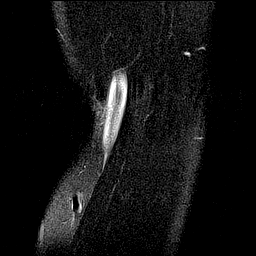
[im 7/25]
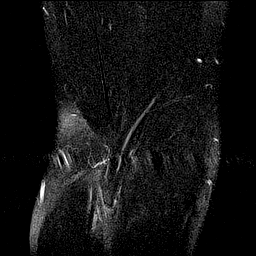
[im 10/25]
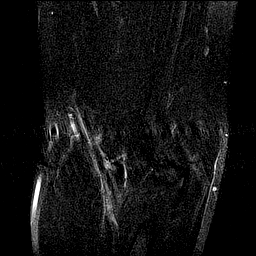
[im 13/25]
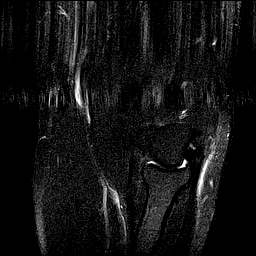
[im 16/25]
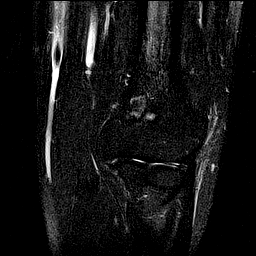
[im 19/25]
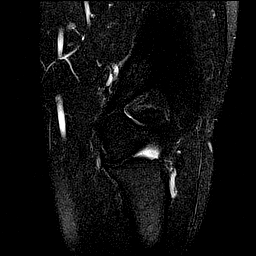
[im 22/25]
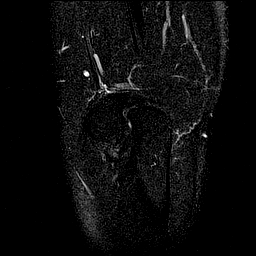
[im 25/25]
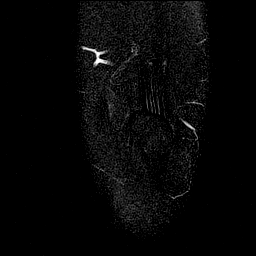

[Series 10: coronal ir if · oblique · 3.5mm · 0.31mm/px · 9 of 27 slices shown]
[im 1/27]
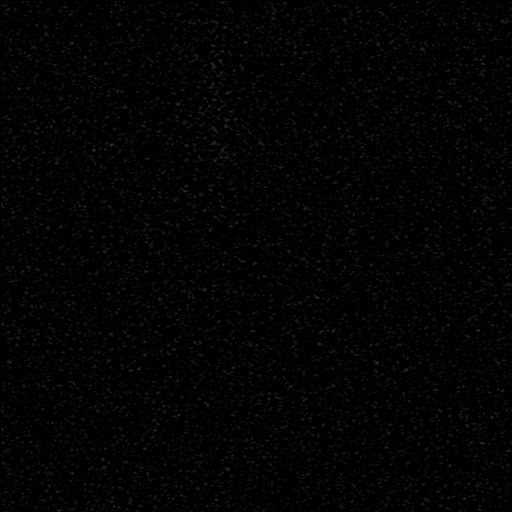
[im 3/27]
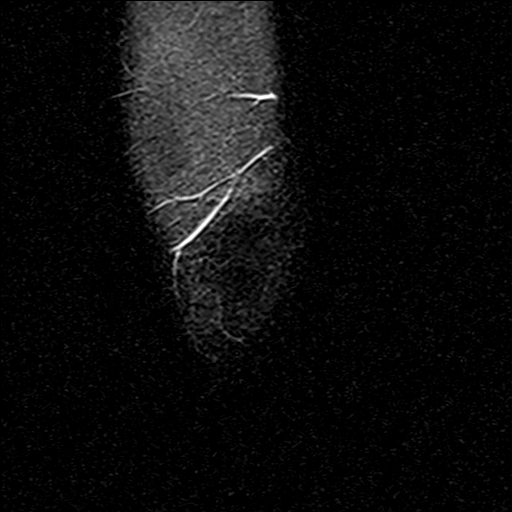
[im 6/27]
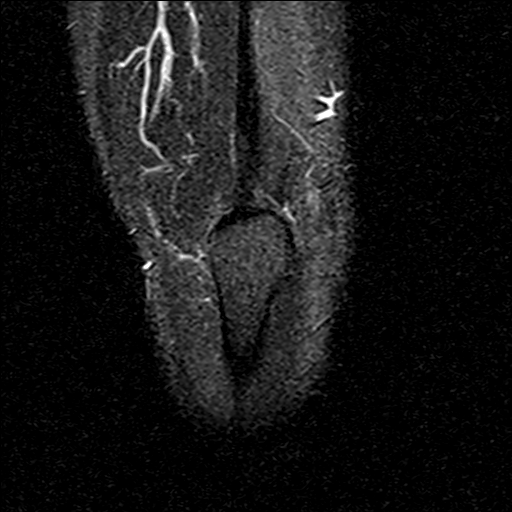
[im 9/27]
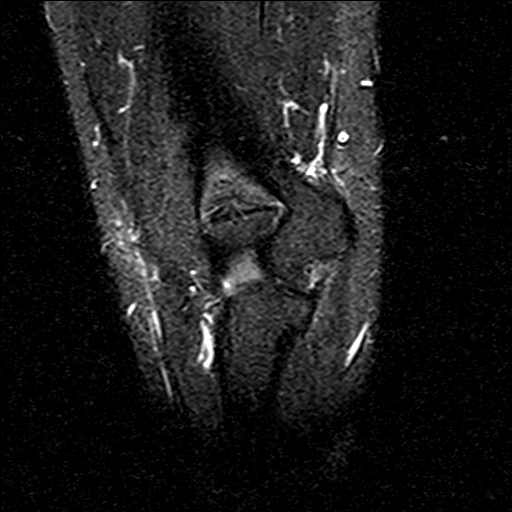
[im 12/27]
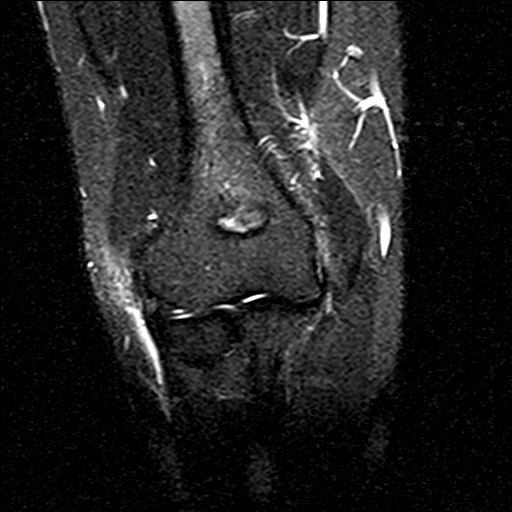
[im 15/27]
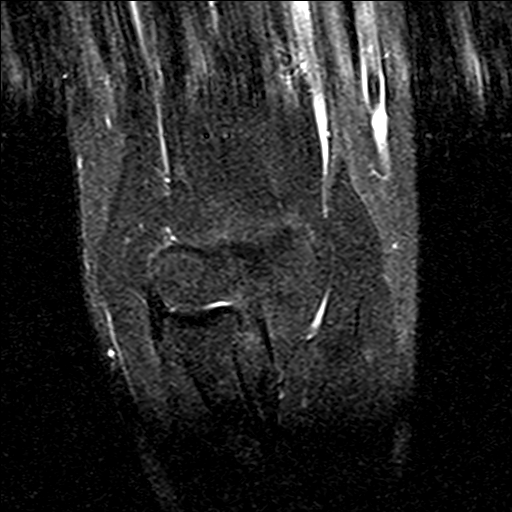
[im 18/27]
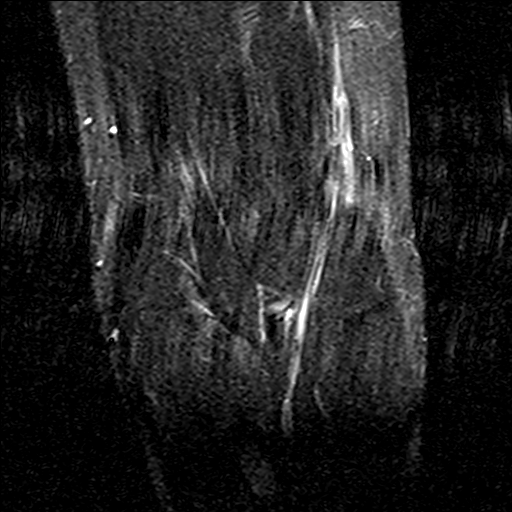
[im 24/27]
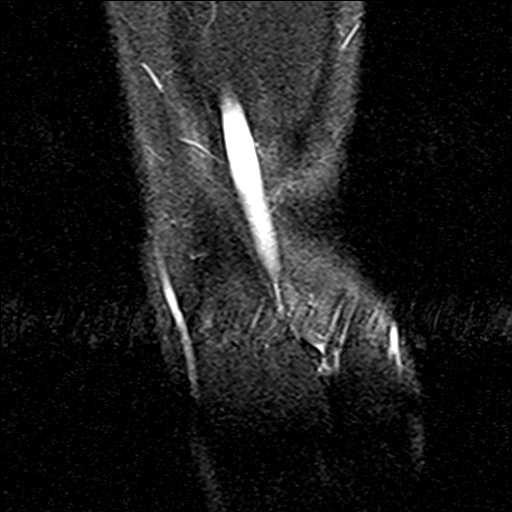
[im 27/27]
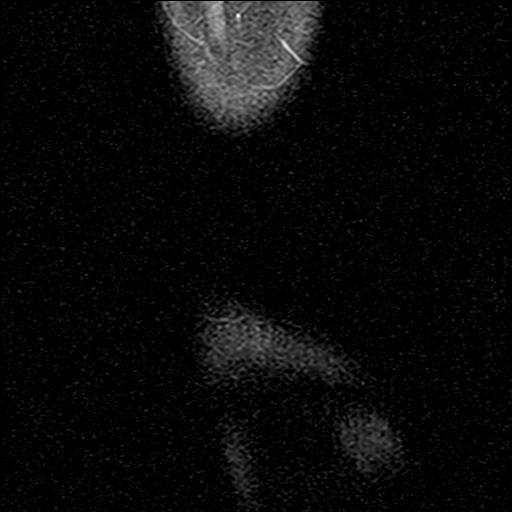

[39 of 40 positions shown; findings below may reference images not displayed]

FINDINGS: TENDONS

Common forearm flexor origin: Intact with normal signal.

Common forearm extensor origin: Mild tendinosis with small
intrasubstance tear (series 8, image 13). Mild overlying soft tissue
swelling.

Biceps: Intact.

Triceps: Intact with normal signal.

LIGAMENTS

Medial stabilizers: Intact.

Lateral stabilizers: The lateral ulnar and radial collateral
ligaments appear intact.

Cartilage: Preserved.  No focal chondral defect demonstrated.

Joint: No joint effusion or loose body observed.

Cubital tunnel: Unremarkable.  The ulnar nerve appears normal.

Bones: No acute or significant extra-articular osseous findings.

Other: None.
IMPRESSION: 1. Mild common forearm extensor tendinosis with small intrasubstance
tear.

## 2023-03-07 ENCOUNTER — Other Ambulatory Visit: Payer: Self-pay

## 2023-03-07 ENCOUNTER — Telehealth: Payer: BC Managed Care – PPO | Admitting: Family Medicine

## 2023-03-07 DIAGNOSIS — J019 Acute sinusitis, unspecified: Secondary | ICD-10-CM

## 2023-03-07 DIAGNOSIS — B9689 Other specified bacterial agents as the cause of diseases classified elsewhere: Secondary | ICD-10-CM | POA: Diagnosis not present

## 2023-03-07 MED ORDER — AZITHROMYCIN 250 MG PO TABS
ORAL_TABLET | ORAL | 0 refills | Status: AC
  Filled 2023-03-07: qty 6, 5d supply, fill #0

## 2023-03-07 NOTE — Progress Notes (Signed)
E-Visit for Sinus Problems  We are sorry that you are not feeling well.  Here is how we plan to help!  Based on what you have shared with me it looks like you have sinusitis.  Sinusitis is inflammation and infection in the sinus cavities of the head.  Based on your presentation I believe you most likely have Acute Bacterial Sinusitis.  This is an infection caused by bacteria and is treated with antibiotics. I have prescribed zithromax. You may use an oral decongestant such as Mucinex D or if you have glaucoma or high blood pressure use plain Mucinex. Saline nasal spray help and can safely be used as often as needed for congestion.  If you develop worsening sinus pain, fever or notice severe headache and vision changes, or if symptoms are not better after completion of antibiotic, please schedule an appointment with a health care provider.    Sinus infections are not as easily transmitted as other respiratory infection, however we still recommend that you avoid close contact with loved ones, especially the very young and elderly.  Remember to wash your hands thoroughly throughout the day as this is the number one way to prevent the spread of infection!  Home Care: Only take medications as instructed by your medical team. Complete the entire course of an antibiotic. Do not take these medications with alcohol. A steam or ultrasonic humidifier can help congestion.  You can place a towel over your head and breathe in the steam from hot water coming from a faucet. Avoid close contacts especially the very young and the elderly. Cover your mouth when you cough or sneeze. Always remember to wash your hands.  Get Help Right Away If: You develop worsening fever or sinus pain. You develop a severe head ache or visual changes. Your symptoms persist after you have completed your treatment plan.  Make sure you Understand these instructions. Will watch your condition. Will get help right away if you are not  doing well or get worse.  Thank you for choosing an e-visit.  Your e-visit answers were reviewed by a board certified advanced clinical practitioner to complete your personal care plan. Depending upon the condition, your plan could have included both over the counter or prescription medications.  Please review your pharmacy choice. Make sure the pharmacy is open so you can pick up prescription now. If there is a problem, you may contact your provider through [employer] and have the prescription routed to another pharmacy.  Your safety is important to [place]. If you have drug allergies check your prescription carefully.   For the next 24 hours you can use MyChart to ask questions about today's visit, request a non-urgent call back, or ask for a work or school excuse. You will get an email in the next two days asking about your experience. I hope that your e-visit has been valuable and will speed your recovery.    have provided 5 minutes of non face to face time during this encounter for chart review and documentation.

## 2023-04-18 ENCOUNTER — Ambulatory Visit
Admission: RE | Admit: 2023-04-18 | Discharge: 2023-04-18 | Disposition: A | Discharge status: Home / Self Care | Payer: BC Managed Care – PPO | Source: Ambulatory Visit | Attending: Family Medicine | Admitting: Family Medicine

## 2023-04-18 ENCOUNTER — Ambulatory Visit: Payer: BC Managed Care – PPO

## 2023-04-18 VITALS — BP 134/76 | HR 79 | Temp 98.4°F | Resp 17

## 2023-04-18 DIAGNOSIS — H6122 Impacted cerumen, left ear: Secondary | ICD-10-CM

## 2023-04-18 DIAGNOSIS — H9203 Otalgia, bilateral: Secondary | ICD-10-CM

## 2023-04-18 DIAGNOSIS — H6691 Otitis media, unspecified, right ear: Secondary | ICD-10-CM | POA: Diagnosis not present

## 2023-04-18 MED ORDER — AMOXICILLIN-POT CLAVULANATE 875-125 MG PO TABS: 1.0000 | ORAL_TABLET | Freq: Two times a day (BID) | ORAL | 0 refills | Status: AC

## 2023-04-18 NOTE — ED Provider Notes (Signed)
[facility]    CSN: [id number] Arrival date & time: 04/18/23  1315      History   Chief Complaint Chief Complaint  Patient presents with   Otalgia    Bilateral, appt 145P    HPI Perry Martin is a 37 y.o. male.   HPI 37 year old male presents with bilateral ear pain for 5 days.  PMH significant for obesity and diverticulitis.  Past Medical History:  Diagnosis Date   Arthritis    (R) shoulder, (L) knee   Complication of anesthesia    has headache after procedure   Diverticula of colon    History of   Family history of adverse reaction to anesthesia    mother has nausea and vomiting and requires scope patch    Patient Active Problem List   Diagnosis Date Noted   Rotator cuff tendinitis, right 01/18/2022   Pain in joint of right shoulder 11/20/2021   Lateral epicondylitis, left elbow 03/02/2018   Allergic rhinitis, seasonal 02/14/2015   H/O diverticulitis of colon 02/14/2015   Impingement syndrome of right shoulder 02/14/2015   Obesity, unspecified 05/21/2013   Skin striae 05/21/2013   Diverticulitis 02/23/2013    Past Surgical History:  Procedure Laterality Date   COLON RESECTION SIGMOID  06/02/2013   second surgery for hematoma   COLONOSCOPY     REPAIR EXTENSOR TENDON Right 03/11/2018   Procedure: DEBRIDEMENT OF THE COMMON EXTENSOR ORGIN OF RIGHT ELBOW;  Surgeon: Christena Flake, MD;  Location: [facility];  Service: Orthopedics;  Laterality: Right;   SHOULDER ARTHROSCOPY WITH SUBACROMIAL DECOMPRESSION, ROTATOR CUFF REPAIR AND BICEP TENDON REPAIR Right 03/27/2022   Procedure: RIGHT SHOULDER ARTHROSCOPY WITH DEBRIDEMENT, DECOMPRESSION, AND BICEPS TENODESIS. - RNFA;  Surgeon: Christena Flake, MD;  Location: [facility];  Service: Orthopedics;  Laterality: Right;   TENNIS ELBOW RELEASE/NIRSCHEL PROCEDURE Left 03/15/2020   Procedure: DEBRIDEMENT OF THE COMMON EXTENSOR ORIGIN OF LEFT ELBOW;  Surgeon: Christena Flake, MD;  Location: [facility];  Service:  Orthopedics;  Laterality: Left;   torn ligament Left    wrist       Home Medications    Prior to Admission medications   Medication Sig Start Date End Date Taking? Authorizing Provider  amoxicillin-clavulanate (AUGMENTIN) 875-125 MG tablet Take 1 tablet by mouth 2 (two) times daily for 10 days. 04/18/23 04/28/23 Yes Trevor Iha, FNP  ibuprofen (ADVIL) 200 MG tablet Take 600 mg by mouth every 6 (six) hours as needed for headache.    [provider]  ipratropium (ATROVENT) 0.03 % nasal spray Place 2 sprays into both nostrils every 12 (twelve) hours. 06/28/22   Viviano Simas, FNP  oxyCODONE (ROXICODONE) 5 MG immediate release tablet Take 1-2 tablets (5-10 mg total) by mouth every 4 (four) hours as needed for moderate pain or severe pain. 03/27/22   Poggi, Excell Seltzer, MD    Family History History reviewed. No pertinent family history.  Social History Social History   Tobacco Use   Smoking status: Former    Current packs/day: 0.00    Average packs/day: 0.3 packs/day for 2.0 years (0.5 ttl pk-yrs)    Types: Cigarettes    Start date: 2012    Quit date: 2014    Years since quitting: 10.9   Smokeless tobacco: Never  Vaping Use   Vaping status: Never Used  Substance Use Topics   Alcohol use: Yes    Comment: 1x/month   Drug use: Never     Allergies   Patient  has no known allergies.   Review of Systems Review of Systems   Physical Exam Triage Vital Signs ED Triage Vitals  Encounter Vitals Group     BP 04/18/23 1338 134/76     Systolic BP Percentile --      Diastolic BP Percentile --      Pulse Rate 04/18/23 1338 79     Resp 04/18/23 1338 17     Temp 04/18/23 1338 98.4 F (36.9 C)     Temp Source 04/18/23 1338 Oral     SpO2 04/18/23 1338 97 %     Weight --      Height --      Head Circumference --      Peak Flow --      Pain Score 04/18/23 1339 2     Pain Loc --      Pain Education --      Exclude from Growth Chart --    No data found.  Updated Vital  Signs BP 134/76 (BP Location: Right Arm)   Pulse 79   Temp 98.4 F (36.9 C) (Oral)   Resp 17   SpO2 97%    Physical Exam Vitals and nursing note reviewed.  Constitutional:      Appearance: Normal appearance. He is normal weight.  HENT:     Head: Normocephalic and atraumatic.     Right Ear: External ear normal.     Left Ear: External ear normal.     Ears:     Comments: Right TM: Red round, erythematous, bulging; left EAC occluded with excessive cerumen unable to visualize left TM post left EAC ear lavage occluding the cerumen remains advised patient follow-up with ENT    Mouth/Throat:     Mouth: Mucous membranes are moist.     Pharynx: Oropharynx is clear.  Eyes:     Extraocular Movements: Extraocular movements intact.     Conjunctiva/sclera: Conjunctivae normal.     Pupils: Pupils are equal, round, and reactive to light.  Cardiovascular:     Rate and Rhythm: Normal rate and regular rhythm.     Pulses: Normal pulses.     Heart sounds: Normal heart sounds.  Pulmonary:     Effort: Pulmonary effort is normal.     Breath sounds: Normal breath sounds. No wheezing, rhonchi or rales.  Musculoskeletal:        General: Normal range of motion.     Cervical back: Normal range of motion and neck supple.  Skin:    General: Skin is warm and dry.  Neurological:     General: No focal deficit present.     Mental Status: He is alert and oriented to person, place, and time. Mental status is at baseline.      UC Treatments / Results  Labs (all labs ordered are listed, but only abnormal results are displayed) Labs Reviewed - No data to display  EKG   Radiology No results found.  Procedures Procedures (including critical care time)  Medications Ordered in UC Medications - No data to display  Initial Impression / Assessment and Plan / UC Course  I have reviewed the triage vital signs and the nursing notes.  Pertinent labs & imaging results that were available during my care  of the patient were reviewed by me and considered in my medical decision making (see chart for details).     MDM: 1.  Acute otalgia, bilaterally-right ear infected, left occluded by cerumen; 2.  Acute right otitis media-Rx'd Augmentin  875/125 mg tablet: Take 1 tablet twice daily x 10 days; 3.  Impacted cerumen of left ear-unresolved after left EAC ear lavage advised ENT follow-up. Advised patient to take medications as directed with food to completion.  Encouraged to increase daily water intake to 64 ounces per day.  Advised patient to follow-up with ENT regarding remaining earwax of left EAC.  Advised if symptoms worsen and/or unresolved please follow-up with PCP, ENT (contact information provided with this AVS today), or here for further evaluation.  Discharged home, hemodynamically stable. Final Clinical Impressions(s) / UC Diagnoses   Final diagnoses:  Acute otalgia, bilateral  Impacted cerumen of left ear  Acute right otitis media     Discharge Instructions      Advised patient to take medications as directed with food to completion.  Encouraged to increase daily water intake to 64 ounces per day.  Advised patient to follow-up with ENT regarding remaining earwax of left EAC.  Advised if symptoms worsen and/or unresolved please follow-up with PCP, ENT (contact information provided with this AVS today), or here for further evaluation.     ED Prescriptions     Medication Sig Dispense Auth. Provider   amoxicillin-clavulanate (AUGMENTIN) 875-125 MG tablet Take 1 tablet by mouth 2 (two) times daily for 10 days. 20 tablet Trevor Iha, FNP      PDMP not reviewed this encounter.   Trevor Iha, FNP 04/18/23 1427

## 2023-04-18 NOTE — Discharge Instructions (Addendum)
Advised patient to take medications as directed with food to completion.  Encouraged to increase daily water intake to 64 ounces per day.  Advised patient to follow-up with ENT regarding remaining earwax of left EAC.  Advised if symptoms worsen and/or unresolved please follow-up with PCP, ENT (contact information provided with this AVS today), or here for further evaluation.

## 2023-04-18 NOTE — ED Triage Notes (Signed)
Pt c/o bilateral ear pain since Monday. Motrin prn.

## 2023-08-09 ENCOUNTER — Telehealth: Admitting: Nurse Practitioner

## 2023-08-09 DIAGNOSIS — M545 Low back pain, unspecified: Secondary | ICD-10-CM

## 2023-08-09 MED ORDER — PREDNISONE 10 MG PO TABS: ORAL_TABLET | ORAL | 0 refills | Status: AC

## 2023-08-09 NOTE — Progress Notes (Signed)
 E-Visit for Back Pain   We are sorry that you are not feeling well.  Here is how we plan to help!  Based on what you have shared with me it looks like you mostly have acute back pain.  Acute back pain is defined as musculoskeletal pain that can resolve in 1-3 weeks with conservative treatment.  I have prescribed prednisone . Keep in mind this is not first line treatment so if you have another evisit or virtual visit for back pain and request prednisone  it could likely not be prescribed.     Some patients experience stomach irritation or in increased heartburn with anti-inflammatory drugs.  Please keep in mind that muscle relaxer's can cause fatigue and should not be taken while at work or driving.  Back pain is very common.  The pain often gets better over time.  The cause of back pain is usually not dangerous.  Most people can learn to manage their back pain on their own.   Home Care Stay active.  Start with short walks on flat ground if you can.  Try to walk farther each day. Do not sit, drive or stand in one place for more than 30 minutes.  Do not stay in bed. Do not avoid exercise or work.  Activity can help your back heal faster. Be careful when you bend or lift an object.  Bend at your knees, keep the object close to you, and do not twist. Sleep on a firm mattress.  Lie on your side, and bend your knees.  If you lie on your back, put a pillow under your knees. Only take medicines as told by your doctor. Put ice on the injured area. Put ice in a plastic bag Place a towel between your skin and the bag Leave the ice on for 15-20 minutes, 3-4 times a day for the first 2-3 days. 210 After that, you can switch between ice and heat packs. Ask your doctor about back exercises or massage. Avoid feeling anxious or stressed.  Find good ways to deal with stress, such as exercise.  Get Help Right Way If: Your pain does not go away with rest or medicine. Your pain does not go away in 1  week. You have new problems. You do not feel well. The pain spreads into your legs. You cannot control when you poop (bowel movement) or pee (urinate) You feel sick to your stomach (nauseous) or throw up (vomit) You have belly (abdominal) pain. You feel like you may pass out (faint). If you develop a fever.  Make Sure you: Understand these instructions. Will watch your condition Will get help right away if you are not doing well or get worse.  Your e-visit answers were reviewed by a board certified advanced clinical practitioner to complete your personal care plan.  Depending on the condition, your plan could have included both over the counter or prescription medications.  If there is a problem please reply  once you have received a response from your provider.  Your safety is important to us .  If you have drug allergies check your prescription carefully.    You can use MyChart to ask questions about today's visit, request a non-urgent call back, or ask for a work or school excuse for 24 hours related to this e-Visit. If it has been greater than 24 hours you will need to follow up with your provider, or enter a new e-Visit to address those concerns.  You will get an e-mail in  the next two days asking about your experience.  I hope that your e-visit has been valuable and will speed your recovery. Thank you for using e-visits.

## 2023-08-09 NOTE — Progress Notes (Signed)
 I have spent 5 minutes in review of e-visit questionnaire, review and updating patient chart, medical decision making and response to patient.   Claiborne Rigg, NP

## 2024-02-12 ENCOUNTER — Other Ambulatory Visit: Payer: Self-pay

## 2024-02-12 MED ORDER — MELOXICAM 15 MG PO TABS
15.0000 mg | ORAL_TABLET | Freq: Every day | ORAL | 0 refills | Status: DC | PRN
  Filled 2024-02-12: qty 30, 30d supply, fill #0

## 2024-05-13 ENCOUNTER — Encounter: Payer: Self-pay | Admitting: Pharmacist

## 2024-05-13 ENCOUNTER — Other Ambulatory Visit: Payer: Self-pay

## 2024-05-13 MED ORDER — ZORYVE 0.3 % EX CREA
TOPICAL_CREAM | CUTANEOUS | 1 refills | Status: AC
  Filled 2024-05-13 – 2024-05-27 (×4): qty 60, 30d supply, fill #0

## 2024-05-13 MED ORDER — PHENTERMINE HCL 37.5 MG PO TABS
37.5000 mg | ORAL_TABLET | Freq: Every morning | ORAL | 2 refills | Status: AC
  Filled 2024-05-13 (×2): qty 30, 30d supply, fill #0

## 2024-05-15 ENCOUNTER — Other Ambulatory Visit: Payer: Self-pay

## 2024-05-18 ENCOUNTER — Other Ambulatory Visit: Payer: Self-pay

## 2024-05-20 ENCOUNTER — Other Ambulatory Visit: Payer: Self-pay

## 2024-05-27 ENCOUNTER — Other Ambulatory Visit: Payer: Self-pay
